# Patient Record
Sex: Female | Born: 1935 | Race: White | Hispanic: No | Marital: Married | Smoking: Former smoker
Health system: Southern US, Community
[De-identification: ages and names within clinical notes are randomized; demographics above are authoritative.]

## PROBLEM LIST (undated history)

## (undated) DIAGNOSIS — Z8709 Personal history of other diseases of the respiratory system: Secondary | ICD-10-CM

## (undated) DIAGNOSIS — J439 Emphysema, unspecified: Secondary | ICD-10-CM

## (undated) DIAGNOSIS — I2510 Atherosclerotic heart disease of native coronary artery without angina pectoris: Secondary | ICD-10-CM

## (undated) DIAGNOSIS — E785 Hyperlipidemia, unspecified: Secondary | ICD-10-CM

## (undated) DIAGNOSIS — I10 Essential (primary) hypertension: Secondary | ICD-10-CM

## (undated) HISTORY — PX: OTHER SURGICAL HISTORY: SHX169

## (undated) HISTORY — PX: CHOLECYSTECTOMY: SHX55

---

## 2015-08-09 ENCOUNTER — Emergency Department: Payer: Medicare Other

## 2015-08-09 ENCOUNTER — Inpatient Hospital Stay
Admission: EM | Admit: 2015-08-09 | Discharge: 2015-08-13 | DRG: 190 | Disposition: A | Discharge status: Home Health | Payer: Medicare Other | Attending: Internal Medicine | Admitting: Internal Medicine

## 2015-08-09 ENCOUNTER — Encounter: Payer: Self-pay | Admitting: Emergency Medicine

## 2015-08-09 DIAGNOSIS — J189 Pneumonia, unspecified organism: Secondary | ICD-10-CM | POA: Diagnosis present

## 2015-08-09 DIAGNOSIS — Z801 Family history of malignant neoplasm of trachea, bronchus and lung: Secondary | ICD-10-CM | POA: Diagnosis not present

## 2015-08-09 DIAGNOSIS — R0600 Dyspnea, unspecified: Secondary | ICD-10-CM

## 2015-08-09 DIAGNOSIS — I509 Heart failure, unspecified: Secondary | ICD-10-CM

## 2015-08-09 DIAGNOSIS — R0902 Hypoxemia: Secondary | ICD-10-CM

## 2015-08-09 DIAGNOSIS — M7989 Other specified soft tissue disorders: Secondary | ICD-10-CM

## 2015-08-09 DIAGNOSIS — I352 Nonrheumatic aortic (valve) stenosis with insufficiency: Secondary | ICD-10-CM | POA: Diagnosis present

## 2015-08-09 DIAGNOSIS — J440 Chronic obstructive pulmonary disease with acute lower respiratory infection: Principal | ICD-10-CM | POA: Diagnosis present

## 2015-08-09 DIAGNOSIS — I5041 Acute combined systolic (congestive) and diastolic (congestive) heart failure: Secondary | ICD-10-CM | POA: Diagnosis present

## 2015-08-09 DIAGNOSIS — Z79899 Other long term (current) drug therapy: Secondary | ICD-10-CM | POA: Diagnosis not present

## 2015-08-09 DIAGNOSIS — E785 Hyperlipidemia, unspecified: Secondary | ICD-10-CM | POA: Diagnosis present

## 2015-08-09 DIAGNOSIS — Z7982 Long term (current) use of aspirin: Secondary | ICD-10-CM

## 2015-08-09 DIAGNOSIS — J9621 Acute and chronic respiratory failure with hypoxia: Secondary | ICD-10-CM | POA: Diagnosis present

## 2015-08-09 DIAGNOSIS — Z87891 Personal history of nicotine dependence: Secondary | ICD-10-CM

## 2015-08-09 DIAGNOSIS — F419 Anxiety disorder, unspecified: Secondary | ICD-10-CM | POA: Diagnosis present

## 2015-08-09 DIAGNOSIS — Z9981 Dependence on supplemental oxygen: Secondary | ICD-10-CM

## 2015-08-09 DIAGNOSIS — J441 Chronic obstructive pulmonary disease with (acute) exacerbation: Secondary | ICD-10-CM

## 2015-08-09 DIAGNOSIS — I350 Nonrheumatic aortic (valve) stenosis: Secondary | ICD-10-CM

## 2015-08-09 DIAGNOSIS — I5021 Acute systolic (congestive) heart failure: Secondary | ICD-10-CM | POA: Insufficient documentation

## 2015-08-09 DIAGNOSIS — I420 Dilated cardiomyopathy: Secondary | ICD-10-CM

## 2015-08-09 HISTORY — DX: Personal history of other diseases of the respiratory system: Z87.09

## 2015-08-09 HISTORY — DX: Emphysema, unspecified: J43.9

## 2015-08-09 LAB — CBC WITH DIFFERENTIAL/PLATELET
Basophils Absolute: 0 10*3/uL (ref 0–0.1)
Basophils Relative: 1 %
EOS ABS: 0.1 10*3/uL (ref 0–0.7)
Eosinophils Relative: 1 %
HCT: 40.4 % (ref 35.0–47.0)
Hemoglobin: 13.4 g/dL (ref 12.0–16.0)
Lymphocytes Relative: 10 %
Lymphs Abs: 0.7 10*3/uL — ABNORMAL LOW (ref 1.0–3.6)
MCH: 30.4 pg (ref 26.0–34.0)
MCHC: 33.1 g/dL (ref 32.0–36.0)
MCV: 91.8 fL (ref 80.0–100.0)
Monocytes Absolute: 0.5 10*3/uL (ref 0.2–0.9)
Monocytes Relative: 6 %
NEUTROS ABS: 6.2 10*3/uL (ref 1.4–6.5)
PLATELETS: 256 10*3/uL (ref 150–440)
RBC: 4.4 MIL/uL (ref 3.80–5.20)
RDW: 14.5 % (ref 11.5–14.5)
WBC: 7.5 10*3/uL (ref 3.6–11.0)

## 2015-08-09 LAB — COMPREHENSIVE METABOLIC PANEL
ALBUMIN: 3.7 g/dL (ref 3.5–5.0)
ALT: 27 U/L (ref 14–54)
ANION GAP: 8 (ref 5–15)
AST: 37 U/L (ref 15–41)
Alkaline Phosphatase: 66 U/L (ref 38–126)
BILIRUBIN TOTAL: 1 mg/dL (ref 0.3–1.2)
BUN: 14 mg/dL (ref 6–20)
CO2: 31 mmol/L (ref 22–32)
Calcium: 9.1 mg/dL (ref 8.9–10.3)
Chloride: 99 mmol/L — ABNORMAL LOW (ref 101–111)
Creatinine, Ser: 0.57 mg/dL (ref 0.44–1.00)
Glucose, Bld: 107 mg/dL — ABNORMAL HIGH (ref 65–99)
POTASSIUM: 4.2 mmol/L (ref 3.5–5.1)
Sodium: 138 mmol/L (ref 135–145)
TOTAL PROTEIN: 6.4 g/dL — AB (ref 6.5–8.1)

## 2015-08-09 LAB — TROPONIN I: Troponin I: <0.03 ng/mL (ref <0.031)

## 2015-08-09 LAB — BRAIN NATRIURETIC PEPTIDE: B NATRIURETIC PEPTIDE 5: 999 pg/mL — AB (ref 0.0–100.0)

## 2015-08-09 LAB — TSH: TSH: 2.045 u[IU]/mL (ref 0.350–4.500)

## 2015-08-09 MED ORDER — ACETAMINOPHEN 650 MG RE SUPP: 650.0000 mg | Freq: Four times a day (QID) | RECTAL | Status: DC | PRN

## 2015-08-09 MED ORDER — ACETAMINOPHEN 325 MG PO TABS: 650.0000 mg | ORAL_TABLET | Freq: Four times a day (QID) | ORAL | Status: DC | PRN

## 2015-08-09 MED ORDER — SENNOSIDES-DOCUSATE SODIUM 8.6-50 MG PO TABS: 1.0000 | ORAL_TABLET | Freq: Every evening | ORAL | Status: DC | PRN

## 2015-08-09 MED ORDER — ENOXAPARIN SODIUM 40 MG/0.4ML ~~LOC~~ SOLN
40.0000 mg | SUBCUTANEOUS | Status: DC
  Administered 2015-08-09 – 2015-08-12 (×4): 40 mg via SUBCUTANEOUS
  Filled 2015-08-09 (×4): qty 0.4

## 2015-08-09 MED ORDER — HYDROCODONE-ACETAMINOPHEN 5-325 MG PO TABS: 1.0000 | ORAL_TABLET | ORAL | Status: DC | PRN

## 2015-08-09 MED ORDER — ASPIRIN 81 MG PO CHEW
81.0000 mg | CHEWABLE_TABLET | Freq: Every day | ORAL | Status: DC
  Administered 2015-08-09 – 2015-08-11 (×3): 81 mg via ORAL
  Filled 2015-08-09 (×3): qty 1

## 2015-08-09 MED ORDER — IPRATROPIUM-ALBUTEROL 0.5-2.5 (3) MG/3ML IN SOLN
3.0000 mL | Freq: Once | RESPIRATORY_TRACT | Status: AC
  Administered 2015-08-09: 3 mL via RESPIRATORY_TRACT
  Filled 2015-08-09: qty 3

## 2015-08-09 MED ORDER — ONDANSETRON HCL 4 MG/2ML IJ SOLN: 4.0000 mg | Freq: Four times a day (QID) | INTRAMUSCULAR | Status: DC | PRN

## 2015-08-09 MED ORDER — FUROSEMIDE 10 MG/ML IJ SOLN
40.0000 mg | Freq: Once | INTRAMUSCULAR | Status: AC
  Administered 2015-08-09: 40 mg via INTRAVENOUS
  Filled 2015-08-09: qty 4

## 2015-08-09 MED ORDER — ALBUTEROL SULFATE (2.5 MG/3ML) 0.083% IN NEBU: 2.5000 mg | INHALATION_SOLUTION | RESPIRATORY_TRACT | Status: DC | PRN

## 2015-08-09 MED ORDER — MONTELUKAST SODIUM 10 MG PO TABS
10.0000 mg | ORAL_TABLET | Freq: Every day | ORAL | Status: DC
  Administered 2015-08-09 – 2015-08-11 (×3): 10 mg via ORAL
  Filled 2015-08-09 (×3): qty 1

## 2015-08-09 MED ORDER — ONDANSETRON HCL 4 MG PO TABS: 4.0000 mg | ORAL_TABLET | Freq: Four times a day (QID) | ORAL | Status: DC | PRN

## 2015-08-09 MED ORDER — MELOXICAM 7.5 MG PO TABS
7.5000 mg | ORAL_TABLET | Freq: Every day | ORAL | Status: DC
  Administered 2015-08-09 – 2015-08-10 (×2): 7.5 mg via ORAL
  Filled 2015-08-09 (×3): qty 1

## 2015-08-09 MED ORDER — TEMAZEPAM 15 MG PO CAPS
30.0000 mg | ORAL_CAPSULE | Freq: Every evening | ORAL | Status: DC | PRN
  Administered 2015-08-10 – 2015-08-12 (×3): 30 mg via ORAL
  Filled 2015-08-09 (×3): qty 2

## 2015-08-09 MED ORDER — SODIUM CHLORIDE 0.9% FLUSH
3.0000 mL | Freq: Two times a day (BID) | INTRAVENOUS | Status: DC
  Administered 2015-08-09 – 2015-08-13 (×8): 3 mL via INTRAVENOUS

## 2015-08-09 MED ORDER — MOMETASONE FURO-FORMOTEROL FUM 200-5 MCG/ACT IN AERO
2.0000 | INHALATION_SPRAY | Freq: Two times a day (BID) | RESPIRATORY_TRACT | Status: DC
  Administered 2015-08-09 – 2015-08-10 (×2): 2 via RESPIRATORY_TRACT
  Filled 2015-08-09: qty 8.8

## 2015-08-09 MED ORDER — CITALOPRAM HYDROBROMIDE 20 MG PO TABS
10.0000 mg | ORAL_TABLET | Freq: Every day | ORAL | Status: DC
  Administered 2015-08-09 – 2015-08-13 (×5): 10 mg via ORAL
  Filled 2015-08-09 (×6): qty 1

## 2015-08-09 MED ORDER — NEBIVOLOL HCL 5 MG PO TABS
5.0000 mg | ORAL_TABLET | Freq: Every day | ORAL | Status: DC
  Administered 2015-08-09 – 2015-08-11 (×3): 5 mg via ORAL
  Filled 2015-08-09 (×3): qty 1

## 2015-08-09 MED ORDER — CALCIUM CARBONATE ANTACID 500 MG PO CHEW: 1.0000 | CHEWABLE_TABLET | ORAL | Status: DC | PRN

## 2015-08-09 MED ORDER — PRAVASTATIN SODIUM 20 MG PO TABS
20.0000 mg | ORAL_TABLET | Freq: Every day | ORAL | Status: DC
  Administered 2015-08-09 – 2015-08-12 (×4): 20 mg via ORAL
  Filled 2015-08-09: qty 1
  Filled 2015-08-09: qty 0.5
  Filled 2015-08-09 (×3): qty 1

## 2015-08-09 MED ORDER — OCUVITE-LUTEIN PO CAPS
1.0000 | ORAL_CAPSULE | Freq: Two times a day (BID) | ORAL | Status: DC
  Administered 2015-08-09 – 2015-08-13 (×8): 1 via ORAL
  Filled 2015-08-09 (×8): qty 1

## 2015-08-09 MED ORDER — IPRATROPIUM-ALBUTEROL 0.5-2.5 (3) MG/3ML IN SOLN
3.0000 mL | RESPIRATORY_TRACT | Status: DC
  Administered 2015-08-09 – 2015-08-13 (×24): 3 mL via RESPIRATORY_TRACT
  Filled 2015-08-09 (×25): qty 3

## 2015-08-09 MED ORDER — FUROSEMIDE 10 MG/ML IJ SOLN
40.0000 mg | Freq: Once | INTRAMUSCULAR | Status: AC
  Administered 2015-08-10: 40 mg via INTRAVENOUS
  Filled 2015-08-09: qty 4

## 2015-08-09 NOTE — Consult Note (Signed)
Order received to provide Adv Directive information to patient.  Provided education to patient and spouse, left documents and let them know to have RN contact spiritual care when they are ready to complete.

## 2015-08-09 NOTE — ED Notes (Signed)
Admitting MD at bedside.

## 2015-08-09 NOTE — Progress Notes (Signed)
Patient admitted to unit from ED for shortness of breath and weakness, patient alert and oriented, denies any pain at thus time. Family at bedside, admission assessment completed at this time.

## 2015-08-09 NOTE — ED Notes (Signed)
Patient ambulated to bathroom x 1 assist.

## 2015-08-09 NOTE — H&P (Addendum)
[place] [facility] [place] NAME: Brooke Trevino    MR#:  [id number]  DATE OF BIRTH:  1936-01-08  DATE OF ADMISSION:  08/09/2015  PRIMARY CARE PHYSICIAN: [facility], Chrissie Noa, MD   REQUESTING/REFERRING PHYSICIAN: Dr Cinda Quest  CHIEF COMPLAINT:   SOB   HISTORY OF PRESENT ILLNESS:  Brooke Trevino  is a 80 y.o. female with a known history of COPD who presents with increasing shortness of breath for the past few days. She also reports PND, orthopnea and lower extremity edema. She is not taking Lasix as she thinks this is drying her skin out. Chest x-ray in the emergency room shows new congestive heart failure and atelectasis. She has been given 49 g IV Lasix  PAST MEDICAL HISTORY:   Past Medical History  Diagnosis Date  . History of COPD   . Emphysema lung ([place])     PAST SURGICAL HISTORY:   Past Surgical History  Procedure Laterality Date  . Cholecystectomy    . Ear drum      Ear drum replacement    SOCIAL HISTORY:   Social History  Substance Use Topics  . Smoking status: Former Smoker    Quit date: 08/08/2005  . Smokeless tobacco: No  . Alcohol Use: No    FAMILY HISTORY:  Lung cancer  DRUG ALLERGIES:  No Known Allergies  REVIEW OF SYSTEMS:   Review of Systems  Constitutional: Negative for fever, chills and malaise/fatigue.  HENT: Negative for ear discharge, ear pain, hearing loss, nosebleeds and sore throat.   Eyes: Negative for blurred vision and pain.  Respiratory: Positive for shortness of breath and wheezing. Negative for cough and hemoptysis.   Cardiovascular: Negative for chest pain, palpitations and leg swelling.  Gastrointestinal: Negative for nausea, vomiting, abdominal pain, diarrhea and blood in stool.  Genitourinary: Negative for dysuria.  Musculoskeletal: Negative for back pain.  Neurological: Negative for dizziness, tremors, speech change, focal weakness, seizures and headaches.  Endo/Heme/Allergies: Does not  bruise/bleed easily.  Psychiatric/Behavioral: Negative for depression, suicidal ideas and hallucinations.    MEDICATIONS AT HOME:   Prior to Admission medications   Medication Sig Start Date End Date Taking? Authorizing Provider  albuterol (PROVENTIL) (2.5 MG/3ML) 0.083% nebulizer solution Take 2.5 mg by nebulization every 4 (four) hours as needed for wheezing or shortness of breath.   Yes Historical Provider, MD  aspirin 81 MG chewable tablet Chew 81 mg by mouth at bedtime.   Yes Historical Provider, MD  calcium carbonate (TUMS - DOSED IN MG ELEMENTAL CALCIUM) 500 MG chewable tablet Chew 1 tablet by mouth as needed for indigestion or heartburn.   Yes Historical Provider, MD  citalopram (CELEXA) 10 MG tablet Take 10 mg by mouth daily.   Yes Historical Provider, MD  Fluticasone-Salmeterol (ADVAIR) 250-50 MCG/DOSE AEPB Inhale 1 puff into the lungs 2 (two) times daily.   Yes Historical Provider, MD  furosemide (LASIX) 40 MG tablet Take 40 mg by mouth daily.   Yes Historical Provider, MD  ipratropium-albuterol (DUONEB) 0.5-2.5 (3) MG/3ML SOLN Take 3 mLs by nebulization every 4 (four) hours.   Yes Historical Provider, MD  meloxicam (MOBIC) 7.5 MG tablet Take 7.5 mg by mouth daily.   Yes Historical Provider, MD  montelukast (SINGULAIR) 10 MG tablet Take 10 mg by mouth at bedtime.   Yes Historical Provider, MD  Multiple Vitamins-Minerals (PRESERVISION/LUTEIN) CAPS Take 1 capsule by mouth 2 (two) times daily.   Yes Historical Provider, MD  nebivolol (BYSTOLIC) 5 MG tablet  Take 5 mg by mouth at bedtime.   Yes Historical Provider, MD  pravastatin (PRAVACHOL) 20 MG tablet Take 20 mg by mouth at bedtime.   Yes Historical Provider, MD  temazepam (RESTORIL) 30 MG capsule Take 30 mg by mouth at bedtime as needed for sleep.   Yes Historical Provider, MD      VITAL SIGNS:  Blood pressure 127/68, pulse 104, temperature 98.4 F (36.9 C), temperature source Oral, resp. rate 24, height '5\' 2"'$  (1.575 m), weight  53.524 kg (118 lb), SpO2 95 %.  PHYSICAL EXAMINATION:   Physical Exam    LABORATORY PANEL:   CBC  Recent Labs Lab 08/09/15 1204  WBC 7.5  HGB 13.4  HCT 40.4  PLT 256   ------------------------------------------------------------------------------------------------------------------  Chemistries   Recent Labs Lab 08/09/15 1204  NA 138  K 4.2  CL 99*  CO2 31  GLUCOSE 107*  BUN 14  CREATININE 0.57  CALCIUM 9.1  AST 37  ALT 27  ALKPHOS 66  BILITOT 1.0   ------------------------------------------------------------------------------------------------------------------  Cardiac Enzymes  Recent Labs Lab 08/09/15 1204  TROPONINI <0.03   ------------------------------------------------------------------------------------------------------------------  RADIOLOGY:  Dg Chest Portable 1 View  08/09/2015  CLINICAL DATA:  Short of breath.  History COPD and emphysema. EXAM: PORTABLE CHEST 1 VIEW COMPARISON:  None. FINDINGS: Hyperinflation. Midline trachea. Mild cardiomegaly. Small bilateral pleural effusions. No pneumothorax. Pulmonary interstitial prominence. Mild. Patchy bibasilar airspace disease. IMPRESSION: Congestive heart failure with bilateral pleural effusions. Bibasilar airspace disease, favoring atelectasis. Hyperinflation, consistent with COPD Electronically Signed   By: Abigail Miyamoto M.D.   On: 08/09/2015 12:35    EKG:  Sinus tachycardia no ST elevation or depression  IMPRESSION AND PLAN:   80 year old female with chronic respiratory failure on 2 L of oxygen due to COPD who presents with acute on chronic hypoxic respiratory failure with new onset CHF.  1. Acute on chronic hypoxic respiratory failure: This is due to new onset congestive heart failure. Wean to her baseline 2 L of oxygen as tolerated. Start ISS. 2. Acute congestive heart failure: Order echocardiogram to evaluate ejection fraction and wall motion abnormalities as well as a possibility of right  heart failure. Continue 40 mg IV Lasix. Check TSH. Monitor intake and output.   3. COPD with acute on chronic hypoxic respiratory failure: Patient does not appear to be in exacerbation at this time. She would benefit from continuing her outpatient nebulizer treatments and inhalers.  4. Hyperlipidemia: Continue pravastatin.    All the records are reviewed and case discussed with ED provider. Management plans discussed with the patient and she in agreement  CODE STATUS: FULL  TOTAL TIME TAKING CARE OF THIS PATIENT: 50 minutes.    Shemeca Lukasik M.D on 08/09/2015 at 2:23 PM  Between 7am to 6pm - Pager - [phone]  After 6pm go to [email or web address] - password [email or web address] [place] [facility]  Office  [phone]  CC: Primary care physician; [facility], Chrissie Noa, MD

## 2015-08-09 NOTE — ED Provider Notes (Signed)
[facility] Emergency Department Provider Note   ____________________________________________  Time seen: Approximately 1:51 PM  I have reviewed the triage vital signs and the nursing notes.   HISTORY  Chief Complaint Shortness of Breath    HPI Brynja Marker is a 80 y.o. female who reports increasing shortness of breath gradually for quite some time. He got very bad in the last 2 days and even worse last night. Patient was satting at 78% on HER-2 liters this morning. She denies any cough or pain just shortness of breath. More swelling in her legs and usual.  Past Medical History  Diagnosis Date  . History of COPD   . Emphysema lung ([place])     There are no active problems to display for this patient.   Past Surgical History  Procedure Laterality Date  . Cholecystectomy    . Ear drum      Ear drum replacement    No current outpatient prescriptions on file.  Allergies Review of patient's allergies indicates no known allergies.  No family history on file.  Social History Social History  Substance Use Topics  . Smoking status: Former Smoker    Quit date: 08/08/2005  . Smokeless tobacco: None  . Alcohol Use: No    Review of Systems Constitutional: No fever/chills Eyes: No visual changes. ENT: No sore throat. Cardiovascular: Denies chest pain. Respiratory:shortness of breath. Gastrointestinal: No abdominal pain.  No nausea, no vomiting.  No diarrhea.  No constipation. Genitourinary: Negative for dysuria. Musculoskeletal: Negative for back pain. Skin: Negative for rash. Neurological: Negative for headaches, focal weakness or numbness.  10-point ROS otherwise negative.  ____________________________________________   PHYSICAL EXAM:  VITAL SIGNS: ED Triage Vitals  Enc Vitals Group     BP 08/09/15 1202 169/98 mmHg     Pulse Rate 08/09/15 1158 103     Resp 08/09/15 1158 25     Temp 08/09/15 1158 98.4 F (36.9 C)   Temp Source 08/09/15 1158 Oral     SpO2 08/09/15 1158 98 %     Weight 08/09/15 1158 118 lb (53.524 kg)     Height 08/09/15 1158 5' 2" (1.575 m)     Head Cir --      Peak Flow --      Pain Score --      Pain Loc --      Pain Edu? --      Excl. in [place]? --     Constitutional: Alert and oriented. Well appearing and in no acute distress. Eyes: Conjunctivae are normal. PERRL. EOMI. Head: Atraumatic. Nose: No congestion/rhinnorhea. Mouth/Throat: Mucous membranes are moist.  Oropharynx non-erythematous. Neck: No stridor.  Cardiovascular: Normal rate, regular rhythm. Grossly normal heart sounds.  Good peripheral circulation. Respiratory: Normal respiratory effort.  No retractions. Lungs CTAB. Gastrointestinal: Soft and nontender. No distention. No abdominal bruits. No CVA tenderness. Musculoskeletal: No lower extremity tenderness nor edema.  No joint effusions. Neurologic:  Normal speech and language. No gross focal neurologic deficits are appreciated. No gait instability. Skin:  Skin is warm, dry and intact. No rash noted. Psychiatric: Mood and affect are normal. Speech and behavior are normal.  ____________________________________________   LABS (all labs ordered are listed, but only abnormal results are displayed)  Labs Reviewed  COMPREHENSIVE METABOLIC PANEL - Abnormal; Notable for the following:    Chloride 99 (*)    Glucose, Bld 107 (*)    Total Protein 6.4 (*)    All other components within normal limits  BRAIN NATRIURETIC PEPTIDE - Abnormal; Notable for the following:    B Natriuretic Peptide 999.0 (*)    All other components within normal limits  CBC WITH DIFFERENTIAL/PLATELET - Abnormal; Notable for the following:    Lymphs Abs 0.7 (*)    All other components within normal limits  TROPONIN I   ____________________________________________  EKG  EKG read and interpreted by me shows sinus tachycardia rate of 1040 axis is a little bit of slight ST segment depression in  the 6. I do not have any old EKGs to compare to ____________________________________________  RADIOLOGY  Chest x-ray looks like a combination of congestive heart failure and COPD. Radiologist agrees with this reading. ____________________________________________   PROCEDURES    ____________________________________________   INITIAL IMPRESSION / ASSESSMENT AND PLAN / ED COURSE  Pertinent labs & imaging results that were available during my care of the patient were reviewed by me and considered in my medical decision making (see chart for details).   ____________________________________________   FINAL CLINICAL IMPRESSION(S) / ED DIAGNOSES  Final diagnoses:  Hypoxia  Dyspnea  Chronic obstructive pulmonary disease with acute exacerbation (HCC)  Acute combined systolic and diastolic congestive heart failure (HCC)      NEW MEDICATIONS STARTED DURING THIS VISIT:  New Prescriptions   No medications on file     Note:  This document was prepared using Dragon voice recognition software and may include unintentional dictation errors.    Nena Polio, MD 08/09/15 1356

## 2015-08-09 NOTE — ED Notes (Signed)
Per EMS, patient comes from home (lives with her husband) due to SOB. Hx of COPD and emphysema. Patient is on 2L of O2 at home. At home she is on a on demand concentrator. When EMS arrived patient was sating 78%. Patient had a breathing treatment at home this morning and EMS gave a duoneb treatment in route and placed patient on 4L [place]. Room air sat here is 78%. Patient placed back on 4L [place] now sating 98%. Patient denies pain. A&O x4. NSR on monitor.

## 2015-08-10 ENCOUNTER — Inpatient Hospital Stay: Payer: Medicare Other

## 2015-08-10 ENCOUNTER — Inpatient Hospital Stay
Admit: 2015-08-10 | Discharge: 2015-08-10 | Disposition: A | Discharge status: Home / Self Care | Payer: Medicare Other | Attending: Internal Medicine | Admitting: Internal Medicine

## 2015-08-10 LAB — BASIC METABOLIC PANEL
ANION GAP: 7 (ref 5–15)
BUN: 13 mg/dL (ref 6–20)
CALCIUM: 8.9 mg/dL (ref 8.9–10.3)
CO2: 35 mmol/L — ABNORMAL HIGH (ref 22–32)
Chloride: 98 mmol/L — ABNORMAL LOW (ref 101–111)
Creatinine, Ser: 0.54 mg/dL (ref 0.44–1.00)
GLUCOSE: 87 mg/dL (ref 65–99)
Potassium: 3.7 mmol/L (ref 3.5–5.1)
SODIUM: 140 mmol/L (ref 135–145)

## 2015-08-10 LAB — CBC
HCT: 40.2 % (ref 35.0–47.0)
HEMOGLOBIN: 13.2 g/dL (ref 12.0–16.0)
MCH: 30.8 pg (ref 26.0–34.0)
MCHC: 32.8 g/dL (ref 32.0–36.0)
MCV: 93.8 fL (ref 80.0–100.0)
Platelets: 231 10*3/uL (ref 150–440)
RBC: 4.29 MIL/uL (ref 3.80–5.20)
RDW: 14.4 % (ref 11.5–14.5)
WBC: 6.6 10*3/uL (ref 3.6–11.0)

## 2015-08-10 LAB — FIBRIN DERIVATIVES D-DIMER (ARMC ONLY): FIBRIN DERIVATIVES D-DIMER (ARMC): 754 — AB (ref 0–499)

## 2015-08-10 MED ORDER — LEVOFLOXACIN IN D5W 750 MG/150ML IV SOLN
750.0000 mg | INTRAVENOUS | Status: DC
  Administered 2015-08-11: 750 mg via INTRAVENOUS
  Filled 2015-08-10 (×4): qty 150

## 2015-08-10 MED ORDER — FUROSEMIDE 10 MG/ML IJ SOLN
20.0000 mg | Freq: Two times a day (BID) | INTRAMUSCULAR | Status: DC
  Administered 2015-08-10: 20 mg via INTRAVENOUS
  Filled 2015-08-10: qty 2

## 2015-08-10 MED ORDER — BUDESONIDE 0.25 MG/2ML IN SUSP
RESPIRATORY_TRACT | Status: AC
  Filled 2015-08-10: qty 2

## 2015-08-10 MED ORDER — BUDESONIDE 0.25 MG/2ML IN SUSP
0.2500 mg | Freq: Two times a day (BID) | RESPIRATORY_TRACT | Status: DC
  Administered 2015-08-10 – 2015-08-13 (×6): 0.25 mg via RESPIRATORY_TRACT
  Filled 2015-08-10 (×4): qty 2

## 2015-08-10 MED ORDER — POTASSIUM CHLORIDE CRYS ER 20 MEQ PO TBCR
20.0000 meq | EXTENDED_RELEASE_TABLET | Freq: Two times a day (BID) | ORAL | Status: DC
  Administered 2015-08-10 – 2015-08-13 (×7): 20 meq via ORAL
  Filled 2015-08-10 (×7): qty 1

## 2015-08-10 MED ORDER — ALBUTEROL SULFATE (2.5 MG/3ML) 0.083% IN NEBU
2.5000 mg | INHALATION_SOLUTION | RESPIRATORY_TRACT | Status: DC
  Administered 2015-08-10: 2.5 mg via RESPIRATORY_TRACT

## 2015-08-10 MED ORDER — ALBUTEROL SULFATE (2.5 MG/3ML) 0.083% IN NEBU: 2.5000 mg | INHALATION_SOLUTION | RESPIRATORY_TRACT | Status: DC | PRN

## 2015-08-10 NOTE — Consult Note (Signed)
Pharmacy Antibiotic Note  Brooke Trevino is a 80 y.o. female admitted on 08/09/2015 with possible PNA.  Pharmacy has been consulted for fevofloxacin dosing.  Plan: levofloxacin '750mg'$  q 48 hours  Height: '5\' 2"'$  (157.5 cm) Weight: 110 lb 12.8 oz (50.259 kg) IBW/kg (Calculated) : 50.1  Temp (24hrs), Avg:97.7 F (36.5 C), Min:97.5 F (36.4 C), Max:97.8 F (36.6 C)   Recent Labs Lab 08/09/15 1204 08/10/15 0454  WBC 7.5 6.6  CREATININE 0.57 0.54    Estimated Creatinine Clearance: 45.8 mL/min (by C-G formula based on Cr of 0.54).    No Known Allergies  Antimicrobials this admission: levofloxacin 5/30 >>    Dose adjustments this admission:   Microbiology results:   Thank you for allowing pharmacy to be a part of this patient's care.  Ramond Dial, Pharm.D Clinical Pharmacist   08/10/2015 5:46 PM

## 2015-08-10 NOTE — Consult Note (Addendum)
Reason for Consult:CHF/COPD Referring Physician: Dr Dinah Beers is an 80 y.o. female.  HPI: Pt c/o of sob and COPD.She denies chest pain no swelling . Patient presents with profound shortness of breath dyspnea she recently moved from [place] head and states emphysema COPD on home O2 for hypoxemia. Patient denies any chest pain denies any cardiac history has a extensive history of smoking over 50 pack years. Patient states to feel reasonably well except for extreme dyspnea denies any cough and no fever chills or sweats no sputum production. There is some concern about possible heart failure as a cardiology was recommended for evaluation patient had not seen a cardiologist before either here or in [place]. Patient denies any leg swelling no PND or orthopnea she still has significant dyspnea with without her oxygen.  Past Medical History  Diagnosis Date  . History of COPD   . Emphysema lung [facility])     Past Surgical History  Procedure Laterality Date  . Cholecystectomy    . Ear drum      Ear drum replacement    History reviewed. No pertinent family history.  Social History:  reports that she quit smoking about 10 years ago. She does not have any smokeless tobacco history on file. She reports that she does not drink alcohol. Her drug history is not on file.  Allergies: No Known Allergies  Medications: I have reviewed the patient's current medications.  Results for orders placed or performed during the hospital encounter of 08/09/15 (from the past 48 hour(s))  Comprehensive metabolic panel     Status: Abnormal   Collection Time: 08/09/15 12:04 PM  Result Value Ref Range   Sodium 138 135 - 145 mmol/L   Potassium 4.2 3.5 - 5.1 mmol/L   Chloride 99 (L) 101 - 111 mmol/L   CO2 31 22 - 32 mmol/L   Glucose, Bld 107 (H) 65 - 99 mg/dL   BUN 14 6 - 20 mg/dL   Creatinine, Ser 0.57 0.44 - 1.00 mg/dL   Calcium 9.1 8.9 - 10.3 mg/dL   Total Protein 6.4 (L) 6.5 - 8.1 g/dL    Albumin 3.7 3.5 - 5.0 g/dL   AST 37 15 - 41 U/L   ALT 27 14 - 54 U/L   Alkaline Phosphatase 66 38 - 126 U/L   Total Bilirubin 1.0 0.3 - 1.2 mg/dL   GFR calc non Af Amer >60 >60 mL/min   GFR calc Af Amer >60 >60 mL/min    Comment: (NOTE) The eGFR has been calculated using the CKD EPI equation. This calculation has not been validated in all clinical situations. eGFR's persistently <60 mL/min signify possible Chronic Kidney Disease.    Anion gap 8 5 - 15  CBC with Differential     Status: Abnormal   Collection Time: 08/09/15 12:04 PM  Result Value Ref Range   WBC 7.5 3.6 - 11.0 K/uL   RBC 4.40 3.80 - 5.20 MIL/uL   Hemoglobin 13.4 12.0 - 16.0 g/dL   HCT 40.4 35.0 - 47.0 %   MCV 91.8 80.0 - 100.0 fL   MCH 30.4 26.0 - 34.0 pg   MCHC 33.1 32.0 - 36.0 g/dL   RDW 14.5 11.5 - 14.5 %   Platelets 256 150 - 440 K/uL   Neutrophils Relative % 82% %   Neutro Abs 6.2 1.4 - 6.5 K/uL   Lymphocytes Relative 10% %   Lymphs Abs 0.7 (L) 1.0 - 3.6 K/uL   Monocytes Relative 6% %  Monocytes Absolute 0.5 0.2 - 0.9 K/uL   Eosinophils Relative 1% %   Eosinophils Absolute 0.1 0 - 0.7 K/uL   Basophils Relative 1% %   Basophils Absolute 0.0 0 - 0.1 K/uL  Troponin I     Status: None   Collection Time: 08/09/15 12:04 PM  Result Value Ref Range   Troponin I <0.03 <0.031 ng/mL    Comment:        NO INDICATION OF MYOCARDIAL INJURY.   TSH     Status: None   Collection Time: 08/09/15 12:04 PM  Result Value Ref Range   TSH 2.045 0.350 - 4.500 uIU/mL  Brain natriuretic peptide     Status: Abnormal   Collection Time: 08/09/15 12:05 PM  Result Value Ref Range   B Natriuretic Peptide 999.0 (H) 0.0 - 100.0 pg/mL  Basic metabolic panel     Status: Abnormal   Collection Time: 08/10/15  4:54 AM  Result Value Ref Range   Sodium 140 135 - 145 mmol/L   Potassium 3.7 3.5 - 5.1 mmol/L   Chloride 98 (L) 101 - 111 mmol/L   CO2 35 (H) 22 - 32 mmol/L   Glucose, Bld 87 65 - 99 mg/dL   BUN 13 6 - 20 mg/dL    Creatinine, Ser 0.54 0.44 - 1.00 mg/dL   Calcium 8.9 8.9 - 10.3 mg/dL   GFR calc non Af Amer >60 >60 mL/min   GFR calc Af Amer >60 >60 mL/min    Comment: (NOTE) The eGFR has been calculated using the CKD EPI equation. This calculation has not been validated in all clinical situations. eGFR's persistently <60 mL/min signify possible Chronic Kidney Disease.    Anion gap 7 5 - 15  CBC     Status: None   Collection Time: 08/10/15  4:54 AM  Result Value Ref Range   WBC 6.6 3.6 - 11.0 K/uL   RBC 4.29 3.80 - 5.20 MIL/uL   Hemoglobin 13.2 12.0 - 16.0 g/dL   HCT 40.2 35.0 - 47.0 %   MCV 93.8 80.0 - 100.0 fL   MCH 30.8 26.0 - 34.0 pg   MCHC 32.8 32.0 - 36.0 g/dL   RDW 14.4 11.5 - 14.5 %   Platelets 231 150 - 440 K/uL  Fibrin derivatives D-Dimer ([facility] only)     Status: Abnormal   Collection Time: 08/10/15 10:46 AM  Result Value Ref Range   Fibrin derivatives D-dimer ([facility]) 754 (H) 0 - 499    Comment: <> Exclusion of Venous Thromboembolism (VTE) - OUTPATIENTS ONLY        (Emergency Department or [place])             0-499 ng/ml (FEU)  : With a low to intermediate pretest                                        probability for VTE this test result                                        excludes the diagnosis of VTE.           > 499 ng/ml (FEU)  : VTE not excluded.  Additional work up  for VTE is required.   <>  Testing on Inpatients and Evaluation of Disseminated Intravascular        Coagulation (DIC)             Reference Range:   0-499 ng/ml (FEU)     US Venous Img Lower Bilateral  08/10/2015  CLINICAL DATA:  Lower extremity swelling. EXAM: BILATERAL LOWER EXTREMITY VENOUS DOPPLER ULTRASOUND TECHNIQUE: Gray-scale sonography with graded compression, as well as color Doppler and duplex ultrasound were performed to evaluate the lower extremity deep venous systems from the level of the common femoral vein and including the common femoral, femoral, profunda  femoral, popliteal and calf veins including the posterior tibial, peroneal and gastrocnemius veins when visible. The superficial great saphenous vein was also interrogated. Spectral Doppler was utilized to evaluate flow at rest and with distal augmentation maneuvers in the common femoral, femoral and popliteal veins. COMPARISON:  None. FINDINGS: RIGHT LOWER EXTREMITY Normal compressibility, augmentation and color Doppler flow in the right common femoral vein, right femoral vein and right popliteal vein. The right saphenofemoral junction is patent. Right profunda femoral vein is patent without thrombus. Visualized right deep calf veins are patent without thrombus. LEFT LOWER EXTREMITY Normal compressibility, augmentation and color Doppler flow in the left common femoral vein, left femoral vein and left popliteal vein. The left saphenofemoral junction is patent. Left profunda femoral vein is patent without thrombus. Visualized left deep calf veins are patent without thrombus. Other Findings:  None. IMPRESSION: No evidence of deep venous thrombosis in the lower extremities. Electronically Signed   By: Markus Daft M.D.   On: 08/10/2015 15:28   Dg Chest Port 1 View  08/10/2015  CLINICAL DATA:  Chronic shortness-of-breath worsening today. EXAM: PORTABLE CHEST 1 VIEW COMPARISON:  08/09/2015 FINDINGS: Lungs are hyperexpanded with persistent bibasilar opacification compatible with small effusions and associated atelectasis without significant change. Cannot completely exclude infection in the lung bases. Stable cardiomegaly. Mild calcified plaque over the aortic arch. Remainder of the exam is unchanged. IMPRESSION: Stable small bilateral pleural effusions likely with associated basilar atelectasis. Cannot exclude infection in the lung bases. Stable cardiomegaly. Electronically Signed   By: Marin Olp M.D.   On: 08/10/2015 11:02   Dg Chest Portable 1 View  08/09/2015  CLINICAL DATA:  Short of breath.  History COPD and  emphysema. EXAM: PORTABLE CHEST 1 VIEW COMPARISON:  None. FINDINGS: Hyperinflation. Midline trachea. Mild cardiomegaly. Small bilateral pleural effusions. No pneumothorax. Pulmonary interstitial prominence. Mild. Patchy bibasilar airspace disease. IMPRESSION: Congestive heart failure with bilateral pleural effusions. Bibasilar airspace disease, favoring atelectasis. Hyperinflation, consistent with COPD Electronically Signed   By: Abigail Miyamoto M.D.   On: 08/09/2015 12:35    Review of Systems  Constitutional: Positive for malaise/fatigue.  HENT: Positive for congestion.   Eyes: Negative.   Respiratory: Positive for cough, shortness of breath and wheezing.   Cardiovascular: Positive for palpitations.  Gastrointestinal: Negative.   Genitourinary: Negative.   Musculoskeletal: Negative.   Skin: Negative.   Neurological: Positive for weakness.  Endo/Heme/Allergies: Negative.   Psychiatric/Behavioral: Negative.    Blood pressure 140/74, pulse 76, temperature 97.5 F (36.4 C), temperature source Oral, resp. rate 22, height 5' 2"  (1.575 m), weight 50.259 kg (110 lb 12.8 oz), SpO2 94 %. Physical Exam  Nursing note and vitals reviewed. Constitutional: She is oriented to person, place, and time. She appears well-developed and well-nourished.  HENT:  Head: Normocephalic and atraumatic.  Eyes: Pupils are equal, round, and reactive to light.  Neck:  Normal range of motion. Neck supple.  Cardiovascular: Normal rate.   Respiratory: Accessory muscle usage present. Tachypnea noted. She has decreased breath sounds. She has wheezes.  GI: Soft. Bowel sounds are normal.  Musculoskeletal: Normal range of motion.  Neurological: She is alert and oriented to person, place, and time. She has normal reflexes.  Skin: Skin is warm and dry.  Psychiatric: Judgment and thought content normal. Her mood appears anxious. Cognition and memory are normal.    Assessment/Plan: Shortness of  breath COPD Hypoxemia Hyperlipidemia Anxiety History of smoking but quit Possible heart failure . PLAN Agree with telemetry Continue inhalers Gross supplemental oxygen Do not recommend increasing dose of diuretics Continue Pravachol for lipid management Benzodiazepines for anxiety Continue diastolic blood pressure control      Brooke Trevino D. 08/10/2015, 4:39 PM

## 2015-08-10 NOTE — Evaluation (Signed)
Physical Therapy Evaluation Patient Details Name: Brooke Trevino MRN: [id number] DOB: 28-Oct-1935 Today's Date: 08/10/2015   History of Present Illness  Brooke Trevino  is a 80 y.o. female with a known history of COPD who presents with increasing shortness of breath for the past few days. She also reports PND, orthopnea and lower extremity edema. She is not taking Lasix as she thinks this is drying her skin out. Chest x-ray in the emergency room shows new congestive heart failure and atelectasis. She has been given 49 g IV Lasix. No falls in the last 12 months.   Clinical Impression  Pt demonstrates significant limitations due to cardiopulmonary function. She is reasonably stable with transfers and ambulation with rolling walker. Pt becomes very short of breath with ambulation from bed to bathroom with SaO2 dropping to 80% on 4 L/min O2. She would benefit from referral for outpatient pulmonary rehab. Encouraged pt to discuss with her pulmonologist. Pt will be safe to return home with husband at discharge. Pt will benefit from skilled PT services to address deficits in strength, balance, and mobility in order to return to full function at home.     Follow Up Recommendations Other (comment) (Pulmonary rehab)    Equipment Recommendations  None recommended by PT    Recommendations for Other Services       Precautions / Restrictions Precautions Precautions: Fall Restrictions Weight Bearing Restrictions: No      Mobility  Bed Mobility Overal bed mobility: Independent             General bed mobility comments: Pt demonstrates good speed and sequencing with supine to sitting at EOB  Transfers Overall transfer level: Needs assistance Equipment used: None Transfers: Sit to/from Stand Sit to Stand: Supervision         General transfer comment: Pt demonstrates good stability and strength with sit to stand transfer. She is able to come to standing with unilateral UE support and  takes small steps from bed to recliner holding onto bed rail. Pt able to perform sit to stand transfer to and from commode  Ambulation/Gait Ambulation/Gait assistance: Min guard Ambulation Distance (Feet): 15 Feet Assistive device: Rolling walker (2 wheeled) Gait Pattern/deviations: Step-through pattern Gait velocity: Decreased Gait velocity interpretation: Below normal speed for age/gender General Gait Details: Pt able to ambulate from bed to bathroom. She demonstrates some instability but corrected with rolling walker. No LE buckling noted. Pt on 4 L/min O2 during ambulation. SaO2 drops to 80% on supplmental O2. Requires extended seated rest break on 6 L/min to recover to 93%. Fatigue monitored throughout ambulation  Stairs            Wheelchair Mobility    Modified Rankin (Stroke Patients Only)       Balance Overall balance assessment: Needs assistance Sitting-balance support: No upper extremity supported Sitting balance-Leahy Scale: Good     Standing balance support: No upper extremity supported Standing balance-Leahy Scale: Fair                               Pertinent Vitals/Pain Pain Assessment: No/denies pain    Home Living Family/patient expects to be discharged to:: Private residence Living Arrangements: Spouse/significant other Available Help at Discharge: Family Type of Home: House Home Access: Level entry     Home Layout: One level Home Equipment: Wheelchair - [employer] - 4 wheels;Cane - single point;Shower seat;Shower seat - built in      Prior  Function Level of Independence: Needs assistance   Gait / Transfers Assistance Needed: Pt reports over the last few weeks she has been using a wheelchair for mobility due to difficulty breathing. Prior to that she would hold onto furniture for short distance ambulation. She is primarily limited by cardiopulmonary status  ADL's / Homemaking Assistance Needed: Independent  Comments: Husband  provides for IADLs     Hand Dominance   Dominant Hand: Right    Extremity/Trunk Assessment   Upper Extremity Assessment: Overall WFL for tasks assessed           Lower Extremity Assessment: Overall WFL for tasks assessed         Communication   Communication: No difficulties  Cognition Arousal/Alertness: Awake/alert Behavior During Therapy: WFL for tasks assessed/performed Overall Cognitive Status: Within Functional Limits for tasks assessed                      General Comments      Exercises        Assessment/Plan    PT Assessment Patient needs continued PT services  PT Diagnosis Difficulty walking;Abnormality of gait;Generalized weakness   PT Problem List Decreased strength;Decreased activity tolerance;Decreased balance;Cardiopulmonary status limiting activity  PT Treatment Interventions DME instruction;Gait training;Functional mobility training;Therapeutic activities;Therapeutic exercise;Balance training;Neuromuscular re-education;Patient/family education   PT Goals (Current goals can be found in the Care Plan section) Acute Rehab PT Goals Patient Stated Goal: "Improve her breathing so she can play with her grandchildren." PT Goal Formulation: With patient Time For Goal Achievement: 08/24/15 Potential to Achieve Goals: Fair    Frequency Min 2X/week   Barriers to discharge        Co-evaluation               End of Session Equipment Utilized During Treatment: Gait belt;Oxygen Activity Tolerance: Patient tolerated treatment well Patient left: in chair;with call bell/phone within reach;with chair alarm set Nurse Communication: Mobility status         Time: 2979-8921 PT Time Calculation (min) (ACUTE ONLY): 34 min   Charges:   PT Evaluation $PT Eval Low Complexity: 1 Procedure PT Treatments $Gait Training: 8-22 mins   PT G Codes:       Lyndel Safe Huprich PT, DPT   Huprich,Jason 08/10/2015, 12:55 PM

## 2015-08-10 NOTE — Progress Notes (Signed)
[facility] [place] NAME: Brooke Trevino    MR#:  [id number]  DATE OF BIRTH:  1936-01-09  SUBJECTIVE:  CHIEF COMPLAINT:   Chief Complaint  Patient presents with  . Shortness of Breath  Patient is 80 year old Caucasian female with medical history significant for history of history of COPD,  who presents to the hospital with complaints of increasing shortness of breath, lower extremity swelling, PND. In emergency room, chest x-ray revealed congestive heart failure, atelectasis. She was given 40 mg of Lasix 2 and at least 1.7 L. Today she does not feel much better despite diuresis, she still complains of shortness of breath, denies any cough, phlegm. Production, or wheezing  Review of Systems  Constitutional: Negative for fever, chills and weight loss.  HENT: Negative for congestion.   Eyes: Negative for blurred vision and double vision.  Respiratory: Positive for shortness of breath. Negative for cough, sputum production and wheezing.   Cardiovascular: Positive for palpitations, leg swelling and PND. Negative for chest pain and orthopnea.  Gastrointestinal: Negative for nausea, vomiting, abdominal pain, diarrhea, constipation and blood in stool.  Genitourinary: Negative for dysuria, urgency, frequency and hematuria.  Musculoskeletal: Negative for falls.  Neurological: Negative for dizziness, tremors, focal weakness and headaches.  Endo/Heme/Allergies: Does not bruise/bleed easily.  Psychiatric/Behavioral: Negative for depression. The patient does not have insomnia.     VITAL SIGNS: Blood pressure 140/74, pulse 76, temperature 97.5 F (36.4 C), temperature source Oral, resp. rate 22, height '5\' 2"'$  (1.575 m), weight 50.259 kg (110 lb 12.8 oz), SpO2 94 %.  PHYSICAL EXAMINATION:   GENERAL:  80 y.o.-year-old patient lying in the bed in moderate to severe respiratory distress, tachypneic, retracting her ribs/ supraclavicular area.  EYES:  Pupils equal, round, reactive to light and accommodation. No scleral icterus. Extraocular muscles intact.  HEENT: Head atraumatic, normocephalic. Oropharynx and nasopharynx clear.  NECK:  Supple, no jugular venous distention. No thyroid enlargement, no tenderness.  LUNGS: Markedly diminished breath sounds bilaterally, no wheezing, rales,rhonchi or crepitation. Using accessory muscles of respiration.  CARDIOVASCULAR: S1, S2 normal. No murmurs, rubs, or gallops.  ABDOMEN: Soft, nontender, nondistended. Bowel sounds present. No organomegaly or mass.  EXTREMITIES: 1+ lower extremity and pedal edema, no cyanosis, or clubbing.  NEUROLOGIC: Cranial nerves II through XII are intact. Muscle strength 5/5 in all extremities. Sensation intact. Gait not checked.  PSYCHIATRIC: The patient is alert and oriented x 3.  SKIN: No obvious rash, lesion, or ulcer.   ORDERS/RESULTS REVIEWED:   CBC  Recent Labs Lab 08/09/15 1204 08/10/15 0454  WBC 7.5 6.6  HGB 13.4 13.2  HCT 40.4 40.2  PLT 256 231  MCV 91.8 93.8  MCH 30.4 30.8  MCHC 33.1 32.8  RDW 14.5 14.4  LYMPHSABS 0.7*  --   MONOABS 0.5  --   EOSABS 0.1  --   BASOSABS 0.0  --    ------------------------------------------------------------------------------------------------------------------  Chemistries   Recent Labs Lab 08/09/15 1204 08/10/15 0454  NA 138 140  K 4.2 3.7  CL 99* 98*  CO2 31 35*  GLUCOSE 107* 87  BUN 14 13  CREATININE 0.57 0.54  CALCIUM 9.1 8.9  AST 37  --   ALT 27  --   ALKPHOS 66  --   BILITOT 1.0  --    ------------------------------------------------------------------------------------------------------------------ estimated creatinine clearance is 45.8 mL/min (by C-G formula based on Cr of 0.54). ------------------------------------------------------------------------------------------------------------------  Recent Labs  08/09/15 1204  TSH 2.045    Cardiac  Enzymes  Recent Labs Lab 08/09/15 1204   TROPONINI <0.03   ------------------------------------------------------------------------------------------------------------------ Invalid input(s): POCBNP ---------------------------------------------------------------------------------------------------------------  RADIOLOGY: US Venous Img Lower Bilateral  08/10/2015  CLINICAL DATA:  Lower extremity swelling. EXAM: BILATERAL LOWER EXTREMITY VENOUS DOPPLER ULTRASOUND TECHNIQUE: Gray-scale sonography with graded compression, as well as color Doppler and duplex ultrasound were performed to evaluate the lower extremity deep venous systems from the level of the common femoral vein and including the common femoral, femoral, profunda femoral, popliteal and calf veins including the posterior tibial, peroneal and gastrocnemius veins when visible. The superficial great saphenous vein was also interrogated. Spectral Doppler was utilized to evaluate flow at rest and with distal augmentation maneuvers in the common femoral, femoral and popliteal veins. COMPARISON:  None. FINDINGS: RIGHT LOWER EXTREMITY Normal compressibility, augmentation and color Doppler flow in the right common femoral vein, right femoral vein and right popliteal vein. The right saphenofemoral junction is patent. Right profunda femoral vein is patent without thrombus. Visualized right deep calf veins are patent without thrombus. LEFT LOWER EXTREMITY Normal compressibility, augmentation and color Doppler flow in the left common femoral vein, left femoral vein and left popliteal vein. The left saphenofemoral junction is patent. Left profunda femoral vein is patent without thrombus. Visualized left deep calf veins are patent without thrombus. Other Findings:  None. IMPRESSION: No evidence of deep venous thrombosis in the lower extremities. Electronically Signed   By: Markus Daft M.D.   On: 08/10/2015 15:28   Dg Chest Port 1 View  08/10/2015  CLINICAL DATA:  Chronic shortness-of-breath worsening  today. EXAM: PORTABLE CHEST 1 VIEW COMPARISON:  08/09/2015 FINDINGS: Lungs are hyperexpanded with persistent bibasilar opacification compatible with small effusions and associated atelectasis without significant change. Cannot completely exclude infection in the lung bases. Stable cardiomegaly. Mild calcified plaque over the aortic arch. Remainder of the exam is unchanged. IMPRESSION: Stable small bilateral pleural effusions likely with associated basilar atelectasis. Cannot exclude infection in the lung bases. Stable cardiomegaly. Electronically Signed   By: Marin Olp M.D.   On: 08/10/2015 11:02   Dg Chest Portable 1 View  08/09/2015  CLINICAL DATA:  Short of breath.  History COPD and emphysema. EXAM: PORTABLE CHEST 1 VIEW COMPARISON:  None. FINDINGS: Hyperinflation. Midline trachea. Mild cardiomegaly. Small bilateral pleural effusions. No pneumothorax. Pulmonary interstitial prominence. Mild. Patchy bibasilar airspace disease. IMPRESSION: Congestive heart failure with bilateral pleural effusions. Bibasilar airspace disease, favoring atelectasis. Hyperinflation, consistent with COPD Electronically Signed   By: Abigail Miyamoto M.D.   On: 08/09/2015 12:35    EKG: No orders found for this or any previous visit.  ASSESSMENT AND PLAN:  Active Problems:   CHF exacerbation ([place]) #1. Acute on chronic respiratory failure with hypoxia, patient is on 3 L of oxygen through nasal cannula at present, on 2 L at home, patient's acute history failure was very likely related to COPD exacerbation as well as possibly acute CHF, initiate patient on budesonide, advance due next every 4 hours around the clock, follow clinically, continue diuretic and low-dose watching. Ins and outs. Patient's d-dimer is elevated at more than 700s, get VQ scan #2. Acute CHF, echocardiogram is  pending, continue diuretic and low-dose #3. COPD exacerbation due to questionable pneumonia, initiate patient on antibiotics, continue budesonide and  DuoNeb nebs #4. Questionable pneumonia, initiate patient on levofloxacin, get sputum culture if possible    Management plans discussed with the patient, family and they are in agreement.   DRUG ALLERGIES: No Known Allergies  CODE STATUS:  Code Status Orders        Start     Ordered   08/09/15 1646  Full code   Continuous     08/09/15 1645    Code Status History    Date Active Date Inactive Code Status Order ID Comments User Context   This patient has a current code status but no historical code status.    Advance Directive Documentation        Most Recent Value   Type of Advance Directive  Healthcare Power of Attorney, Living will   Pre-existing out of facility DNR order (yellow form or pink MOST form)     "MOST" Form in Place?        TOTALCritical care  TIME TAKING CARE OF THIS PATIENT: 40  minutes.    Theodoro Grist M.D on 08/10/2015 at 5:01 PM  Between 7am to 6pm - Pager - [phone]  After 6pm go to [email or web address] - password [email or web address] [facility]  [facility]  Office  [phone]  CC: Primary care physician; [facility], Chrissie Noa, MD

## 2015-08-10 NOTE — Care Management (Signed)
Presents from home with a history of COPD and chronic O2 though [facility]. Admitted with shortness of breath. New diagnosis of CHF. PCP is Dr. Ellison Hughs at [facility].  Patient states she does not have scales but states her spouse if bringing her some today.Discussed tele health program through [facility]. Patient is agreeable. Her spouse is her primary caregiver.

## 2015-08-10 NOTE — Progress Notes (Signed)
*  PRELIMINARY RESULTS* Echocardiogram 2D Echocardiogram has been performed.  Laqueta Jean Hege 08/10/2015, 9:03 AM

## 2015-08-11 LAB — BASIC METABOLIC PANEL
Anion gap: 6 (ref 5–15)
BUN: 11 mg/dL (ref 6–20)
CHLORIDE: 94 mmol/L — AB (ref 101–111)
CO2: 38 mmol/L — ABNORMAL HIGH (ref 22–32)
CREATININE: 0.48 mg/dL (ref 0.44–1.00)
Calcium: 9.3 mg/dL (ref 8.9–10.3)
Glucose, Bld: 97 mg/dL (ref 65–99)
POTASSIUM: 3.8 mmol/L (ref 3.5–5.1)
SODIUM: 138 mmol/L (ref 135–145)

## 2015-08-11 MED ORDER — FUROSEMIDE 20 MG PO TABS
20.0000 mg | ORAL_TABLET | Freq: Every day | ORAL | Status: DC
  Administered 2015-08-11 – 2015-08-12 (×2): 20 mg via ORAL
  Filled 2015-08-11 (×2): qty 1

## 2015-08-11 MED ORDER — ASPIRIN 81 MG PO CHEW
81.0000 mg | CHEWABLE_TABLET | Freq: Every day | ORAL | Status: DC
  Administered 2015-08-12 – 2015-08-13 (×2): 81 mg via ORAL
  Filled 2015-08-11 (×2): qty 1

## 2015-08-11 MED ORDER — NEBIVOLOL HCL 5 MG PO TABS
5.0000 mg | ORAL_TABLET | Freq: Every day | ORAL | Status: DC
  Administered 2015-08-12: 5 mg via ORAL
  Filled 2015-08-11: qty 1

## 2015-08-11 MED ORDER — MONTELUKAST SODIUM 10 MG PO TABS
10.0000 mg | ORAL_TABLET | Freq: Every day | ORAL | Status: DC
  Administered 2015-08-12 – 2015-08-13 (×2): 10 mg via ORAL
  Filled 2015-08-11 (×2): qty 1

## 2015-08-11 MED ORDER — BUDESONIDE 0.25 MG/2ML IN SUSP
RESPIRATORY_TRACT | Status: AC
  Filled 2015-08-11: qty 2

## 2015-08-11 NOTE — Progress Notes (Signed)
Physical Therapy Treatment Patient Details Name: Brooke Trevino MRN: [id number] DOB: Jul 14, 1935 Today's Date: 08/11/2015    History of Present Illness Brooke Trevino  is a 80 y.o. female with a known history of COPD who presents with increasing shortness of breath for the past few days. She also reports PND, orthopnea and lower extremity edema. She is not taking Lasix as she thinks this is drying her skin out. Chest x-ray in the emergency room shows new congestive heart failure and atelectasis. She has been given 49 g IV Lasix. No falls in the last 12 months.     PT Comments    Patient reports feeling much better on this date and agrees to attempt further distance ambulation with PT. She appears somewhat depressed about her decreased mobility initially, but appears rather optimistic by the conclusion of the session. She would benefit from HHPT eval as she is limited in her tolerance for mobility currently to very short distances with substantial rest breaks required to regain O2 sats. Home set up may be appropriate with pacing strategies discussed in acute setting today. Overall significant improvement in mobility today from previous session.   Follow Up Recommendations  Home health PT (Progress to Pulmonary rehab at later date)     Equipment Recommendations       Recommendations for Other Services       Precautions / Restrictions Precautions Precautions: Fall Restrictions Weight Bearing Restrictions: No    Mobility  Bed Mobility Overal bed mobility: Independent             General bed mobility comments: No deficits noted.   Transfers Overall transfer level: Needs assistance Equipment used: Rolling walker (2 wheeled) Transfers: Sit to/from Stand Sit to Stand: Supervision         General transfer comment: Appropriate hand placement, sequencing with transfer, no loss of balance.   Ambulation/Gait Ambulation/Gait assistance: Supervision Ambulation Distance  (Feet):  (3 bouts - 1st 29f, second 60', 3rd - 100') Assistive device: Rolling walker (2 wheeled) Gait Pattern/deviations: Step-through pattern;Decreased step length - right;Decreased step length - left   Gait velocity interpretation: Below normal speed for age/gender General Gait Details: Patient performed 3 separate bouts of ambulation, all on 3L of O2. She desaturated to 83-85% after each bout, returned within 2 minutes to her baseline of 92-96% on all bouts (did increase temporarily to 4L after 3rd bout due to prolonged time to recover).    Stairs            Wheelchair Mobility    Modified Rankin (Stroke Patients Only)       Balance Overall balance assessment: Needs assistance Sitting-balance support: No upper extremity supported Sitting balance-Leahy Scale: Good     Standing balance support: Bilateral upper extremity supported Standing balance-Leahy Scale: Good                      Cognition Arousal/Alertness: Awake/alert Behavior During Therapy: WFL for tasks assessed/performed Overall Cognitive Status: Within Functional Limits for tasks assessed                      Exercises      General Comments        Pertinent Vitals/Pain Pain Assessment: No/denies pain    Home Living                      Prior Function            PT  Goals (current goals can now be found in the care plan section) Acute Rehab PT Goals Patient Stated Goal: "Improve her breathing so she can play with her grandchildren." PT Goal Formulation: With patient Time For Goal Achievement: 08/24/15 Potential to Achieve Goals: Good Progress towards PT goals: Progressing toward goals    Frequency  Min 2X/week    PT Plan Discharge plan needs to be updated    Co-evaluation             End of Session Equipment Utilized During Treatment: Gait belt;Oxygen Activity Tolerance: Patient tolerated treatment well;Patient limited by fatigue Patient left: in  chair;with call bell/phone within reach;with chair alarm set     Time: 1425-1455 PT Time Calculation (min) (ACUTE ONLY): 30 min  Charges:  $Gait Training: 23-37 mins                    G Codes:      Kerman Passey, PT, DPT    08/11/2015, 5:22 PM

## 2015-08-11 NOTE — Discharge Instructions (Signed)
Heart Failure Clinic appointment on August 17, 2015 at 11:00am with Darylene Price, [place]. Please call [phone] to reschedule.

## 2015-08-11 NOTE — Progress Notes (Signed)
[place] at [place] NAME: Brooke Trevino    MR#:  [id number]  DATE OF BIRTH:  02/27/1936  SUBJECTIVE:  CHIEF COMPLAINT:   Chief Complaint  Patient presents with  . Shortness of Breath  Patient is 80 year old Caucasian female with medical history significant for history of history of COPD,  who presents to the hospital with complaints of increasing shortness of breath, lower extremity swelling, PND. In emergency room, chest x-ray revealed congestive heart failure, atelectasis. She was given  Lasix And diuresed about 2.2 L since admission, she was initiated on antibiotic therapy, nebulizers, she feels improved today  Review of Systems  Constitutional: Negative for fever, chills and weight loss.  HENT: Negative for congestion.   Eyes: Negative for blurred vision and double vision.  Respiratory: Positive for shortness of breath. Negative for cough, sputum production and wheezing.   Cardiovascular: Positive for palpitations, leg swelling and PND. Negative for chest pain and orthopnea.  Gastrointestinal: Negative for nausea, vomiting, abdominal pain, diarrhea, constipation and blood in stool.  Genitourinary: Negative for dysuria, urgency, frequency and hematuria.  Musculoskeletal: Negative for falls.  Neurological: Negative for dizziness, tremors, focal weakness and headaches.  Endo/Heme/Allergies: Does not bruise/bleed easily.  Psychiatric/Behavioral: Negative for depression. The patient does not have insomnia.     VITAL SIGNS: Blood pressure 111/71, pulse 81, temperature 98.5 F (36.9 C), temperature source Oral, resp. rate 20, height '5\' 2"'$  (1.575 m), weight 50.259 kg (110 lb 12.8 oz), SpO2 99 %.  PHYSICAL EXAMINATION:   GENERAL:  80 y.o.-year-old patient lying in the bed in mild respiratory distress. Tachypneic whenever starts talking, able to speak short sentences EYES: Pupils equal, round, reactive to light and accommodation. No  scleral icterus. Extraocular muscles intact.  HEENT: Head atraumatic, normocephalic. Oropharynx and nasopharynx clear.  NECK:  Supple, no jugular venous distention. No thyroid enlargement, no tenderness.  LUNGS: Better air entrance bilaterally, intermittent wheezing, especially in right posterior lung field, overall diminished breath sounds bilaterally, no rales,rhonchi or crepitation. Using accessory muscles of respiration. Intermittently, especially with speech and moving around.  CARDIOVASCULAR: S1, S2 normal. No murmurs, rubs, or gallops.  ABDOMEN: Soft, nontender, nondistended. Bowel sounds present. No organomegaly or mass.  EXTREMITIES: 1+ lower extremity and pedal edema, no cyanosis, or clubbing.  NEUROLOGIC: Cranial nerves II through XII are intact. Muscle strength 5/5 in all extremities. Sensation intact. Gait not checked.  PSYCHIATRIC: The patient is alert and oriented x 3.  SKIN: No obvious rash, lesion, or ulcer.   ORDERS/RESULTS REVIEWED:   CBC  Recent Labs Lab 08/09/15 1204 08/10/15 0454  WBC 7.5 6.6  HGB 13.4 13.2  HCT 40.4 40.2  PLT 256 231  MCV 91.8 93.8  MCH 30.4 30.8  MCHC 33.1 32.8  RDW 14.5 14.4  LYMPHSABS 0.7*  --   MONOABS 0.5  --   EOSABS 0.1  --   BASOSABS 0.0  --    ------------------------------------------------------------------------------------------------------------------  Chemistries   Recent Labs Lab 08/09/15 1204 08/10/15 0454 08/11/15 0412  NA 138 140 138  K 4.2 3.7 3.8  CL 99* 98* 94*  CO2 31 35* 38*  GLUCOSE 107* 87 97  BUN '14 13 11  '$ CREATININE 0.57 0.54 0.48  CALCIUM 9.1 8.9 9.3  AST 37  --   --   ALT 27  --   --   ALKPHOS 66  --   --   BILITOT 1.0  --   --    ------------------------------------------------------------------------------------------------------------------ estimated  creatinine clearance is 45.8 mL/min (by C-G formula based on Cr of  0.48). ------------------------------------------------------------------------------------------------------------------  Recent Labs  08/09/15 1204  TSH 2.045    Cardiac Enzymes  Recent Labs Lab 08/09/15 1204  TROPONINI <0.03   ------------------------------------------------------------------------------------------------------------------ Invalid input(s): POCBNP ---------------------------------------------------------------------------------------------------------------  RADIOLOGY: US Venous Img Lower Bilateral  08/10/2015  CLINICAL DATA:  Lower extremity swelling. EXAM: BILATERAL LOWER EXTREMITY VENOUS DOPPLER ULTRASOUND TECHNIQUE: Gray-scale sonography with graded compression, as well as color Doppler and duplex ultrasound were performed to evaluate the lower extremity deep venous systems from the level of the common femoral vein and including the common femoral, femoral, profunda femoral, popliteal and calf veins including the posterior tibial, peroneal and gastrocnemius veins when visible. The superficial great saphenous vein was also interrogated. Spectral Doppler was utilized to evaluate flow at rest and with distal augmentation maneuvers in the common femoral, femoral and popliteal veins. COMPARISON:  None. FINDINGS: RIGHT LOWER EXTREMITY Normal compressibility, augmentation and color Doppler flow in the right common femoral vein, right femoral vein and right popliteal vein. The right saphenofemoral junction is patent. Right profunda femoral vein is patent without thrombus. Visualized right deep calf veins are patent without thrombus. LEFT LOWER EXTREMITY Normal compressibility, augmentation and color Doppler flow in the left common femoral vein, left femoral vein and left popliteal vein. The left saphenofemoral junction is patent. Left profunda femoral vein is patent without thrombus. Visualized left deep calf veins are patent without thrombus. Other Findings:  None. IMPRESSION:  No evidence of deep venous thrombosis in the lower extremities. Electronically Signed   By: Markus Daft M.D.   On: 08/10/2015 15:28   Dg Chest Port 1 View  08/10/2015  CLINICAL DATA:  Chronic shortness-of-breath worsening today. EXAM: PORTABLE CHEST 1 VIEW COMPARISON:  08/09/2015 FINDINGS: Lungs are hyperexpanded with persistent bibasilar opacification compatible with small effusions and associated atelectasis without significant change. Cannot completely exclude infection in the lung bases. Stable cardiomegaly. Mild calcified plaque over the aortic arch. Remainder of the exam is unchanged. IMPRESSION: Stable small bilateral pleural effusions likely with associated basilar atelectasis. Cannot exclude infection in the lung bases. Stable cardiomegaly. Electronically Signed   By: Marin Olp M.D.   On: 08/10/2015 11:02    EKG: No orders found for this or any previous visit.  ASSESSMENT AND PLAN:  Active Problems:   CHF exacerbation ([place]) #1. Acute on chronic respiratory failure with hypoxia due to acute CHF and COPD exacerbation, patient remains on 3 L of oxygen through nasal cannula at present, on 2 L at home, continue low-dose Lasix orally, watching ins and outs, continue budesonide, DuoNeb nebs.  #2. Acute CHF, echocardiogram is  pending, continue diuretic at low-dose orally, following ins and outs #3. COPD exacerbation due to pneumonia, continue levofloxacin, get sputum culture if possible, continue budesonide and DuoNeb nebs, patient has improved, however, she is wheezing today, likely due to better opening of airways. #4. Basilar pneumonia, continue patient on levofloxacin, get sputum culture if possible  #5. Lower extremity swelling, no DVT in lower extremities on ultrasound, continue DVT prophylaxis  Management plans discussed with the patient, family and they are in agreement.   DRUG ALLERGIES: No Known Allergies  CODE STATUS:     Code Status Orders        Start     Ordered    08/09/15 1646  Full code   Continuous     08/09/15 1645    Code Status History    Date Active Date Inactive Code Status Order  ID Comments User Context   This patient has a current code status but no historical code status.    Advance Directive Documentation        Most Recent Value   Type of Advance Directive  Healthcare Power of Attorney, Living will   Pre-existing out of facility DNR order (yellow form or pink MOST form)     "MOST" Form in Place?        TOTAL TIME TAKING CARE OF THIS PATIENT: 40  minutes.    Theodoro Grist M.D on 08/11/2015 at 5:17 PM  Between 7am to 6pm - Pager - [phone]  After 6pm go to [email or web address] - password [email or web address]  [facility]  Office  [phone]  CC: Primary care physician; [facility], Chrissie Noa, MD

## 2015-08-11 NOTE — Progress Notes (Signed)
Initial Heart Failure Clinic appointment scheduled for August 17, 2015 at 11:00am. Thank you.

## 2015-08-12 LAB — ECHOCARDIOGRAM COMPLETE
HEIGHTINCHES: 62 in
Weight: 1888 oz

## 2015-08-12 MED ORDER — LEVOFLOXACIN 500 MG PO TABS
750.0000 mg | ORAL_TABLET | ORAL | Status: DC
  Administered 2015-08-13: 750 mg via ORAL
  Filled 2015-08-12: qty 2

## 2015-08-12 MED ORDER — CARVEDILOL 6.25 MG PO TABS
6.2500 mg | ORAL_TABLET | Freq: Two times a day (BID) | ORAL | Status: DC
  Administered 2015-08-12 – 2015-08-13 (×2): 6.25 mg via ORAL
  Filled 2015-08-12 (×2): qty 1

## 2015-08-12 MED ORDER — METHYLPREDNISOLONE SODIUM SUCC 125 MG IJ SOLR
60.0000 mg | INTRAMUSCULAR | Status: DC
  Administered 2015-08-12: 60 mg via INTRAVENOUS
  Filled 2015-08-12: qty 2

## 2015-08-12 MED ORDER — LISINOPRIL 5 MG PO TABS
5.0000 mg | ORAL_TABLET | Freq: Every day | ORAL | Status: DC
  Administered 2015-08-12 – 2015-08-13 (×2): 5 mg via ORAL
  Filled 2015-08-12 (×2): qty 1

## 2015-08-12 MED ORDER — FUROSEMIDE 10 MG/ML IJ SOLN
20.0000 mg | Freq: Two times a day (BID) | INTRAMUSCULAR | Status: DC
  Administered 2015-08-12 – 2015-08-13 (×3): 20 mg via INTRAVENOUS
  Filled 2015-08-12 (×3): qty 2

## 2015-08-12 MED ORDER — FUROSEMIDE 10 MG/ML IJ SOLN
20.0000 mg | Freq: Once | INTRAMUSCULAR | Status: AC
  Administered 2015-08-12: 20 mg via INTRAVENOUS
  Filled 2015-08-12: qty 2

## 2015-08-12 NOTE — Progress Notes (Signed)
[place] at [place] NAME: Brooke Trevino    MR#:  [id number]  DATE OF BIRTH:  1935-08-31  SUBJECTIVE:  CHIEF COMPLAINT:   Chief Complaint  Patient presents with  . Shortness of Breath  Patient is 80 year old Caucasian female with medical history significant for history of history of COPD,  who presents to the hospital with complaints of increasing shortness of breath, lower extremity swelling, PND. In emergency room, chest x-ray revealed congestive heart failure, atelectasis. She was given  Lasix And diuresed about 2.9 L since admission, she was initiated on antibiotic therapy, nebulizers, she feels improved todayBut not as good as she felt yesterday  Patient was seen by cardiologist  yesterday, appreciate recommendations. Echocardiogram revealed cardiomyopathy with ejection fraction of 20-25%, Coreg and lisinopril were recommended by Dr. Clayborn Bigness.     Review of Systems  Constitutional: Negative for fever, chills and weight loss.  HENT: Negative for congestion.   Eyes: Negative for blurred vision and double vision.  Respiratory: Positive for shortness of breath. Negative for cough, sputum production and wheezing.   Cardiovascular: Positive for palpitations, leg swelling and PND. Negative for chest pain and orthopnea.  Gastrointestinal: Negative for nausea, vomiting, abdominal pain, diarrhea, constipation and blood in stool.  Genitourinary: Negative for dysuria, urgency, frequency and hematuria.  Musculoskeletal: Negative for falls.  Neurological: Negative for dizziness, tremors, focal weakness and headaches.  Endo/Heme/Allergies: Does not bruise/bleed easily.  Psychiatric/Behavioral: Negative for depression. The patient does not have insomnia.     VITAL SIGNS: Blood pressure 125/78, pulse 86, temperature 98.3 F (36.8 C), temperature source Oral, resp. rate 18, height '5\' 2"'$  (1.575 m), weight 50.259 kg (110 lb 12.8 oz), SpO2 94  %.  PHYSICAL EXAMINATION:   GENERAL:  80 y.o.-year-old patient lying in the bed in mild respiratory distress. Tachypneic whenever starts talking, able to speak short sentences EYES: Pupils equal, round, reactive to light and accommodation. No scleral icterus. Extraocular muscles intact.  HEENT: Head atraumatic, normocephalic. Oropharynx and nasopharynx clear.  NECK:  K venous distention. No thyroid enlargement, no tenderness.  LUNGS: Crackles in posterior bases , inspiratory and expiratory  wheezing,  diminished breath sounds bilaterally, no rales,rhonchi or crepitation. Using accessory muscles of respiration. Intermittently, especially with speech and moving around.  CARDIOVASCULAR: S1, S2 normal. No murmurs, rubs, or gallops.  ABDOMEN: Soft, nontender, nondistended. Bowel sounds present. No organomegaly or mass.  EXTREMITIES: 1+ lower extremity and pedal edema, no cyanosis, or clubbing.  NEUROLOGIC: Cranial nerves II through XII are intact. Muscle strength 5/5 in all extremities. Sensation intact. Gait not checked.  PSYCHIATRIC: The patient is alert and oriented x 3.  SKIN: No obvious rash, lesion, or ulcer.   ORDERS/RESULTS REVIEWED:   CBC  Recent Labs Lab 08/09/15 1204 08/10/15 0454  WBC 7.5 6.6  HGB 13.4 13.2  HCT 40.4 40.2  PLT 256 231  MCV 91.8 93.8  MCH 30.4 30.8  MCHC 33.1 32.8  RDW 14.5 14.4  LYMPHSABS 0.7*  --   MONOABS 0.5  --   EOSABS 0.1  --   BASOSABS 0.0  --    ------------------------------------------------------------------------------------------------------------------  Chemistries   Recent Labs Lab 08/09/15 1204 08/10/15 0454 08/11/15 0412  NA 138 140 138  K 4.2 3.7 3.8  CL 99* 98* 94*  CO2 31 35* 38*  GLUCOSE 107* 87 97  BUN '14 13 11  '$ CREATININE 0.57 0.54 0.48  CALCIUM 9.1 8.9 9.3  AST 37  --   --  ALT 27  --   --   ALKPHOS 66  --   --   BILITOT 1.0  --   --     ------------------------------------------------------------------------------------------------------------------ estimated creatinine clearance is 45.8 mL/min (by C-G formula based on Cr of 0.48). ------------------------------------------------------------------------------------------------------------------ No results for input(s): TSH, T4TOTAL, T3FREE, THYROIDAB in the last 72 hours.  Invalid input(s): FREET3  Cardiac Enzymes  Recent Labs Lab 08/09/15 1204  TROPONINI <0.03   ------------------------------------------------------------------------------------------------------------------ Invalid input(s): POCBNP ---------------------------------------------------------------------------------------------------------------  RADIOLOGY: No results found.  EKG: No orders found for this or any previous visit.  ASSESSMENT AND PLAN:  Active Problems:   CHF exacerbation ([place]) #1. Acute on chronic respiratory failure with hypoxia due to acute Systolic CHF and COPD exacerbation, patient is on 4 L of oxygen through nasal cannula at present, on 2 L at home, continue  Lasix intravenously, watching ins and outs, continue budesonide, DuoNeb nebs.  #2. Acute systolic CHF, echocardiogram revealed ejection fraction at 20/25 percent, normal wall motion, continue Lasix, adding Coreg, lisinopril, following ins and outs, oxygenation  #3. COPD exacerbation due to pneumonia, continue levofloxacin, get sputum culture if possible, continue budesonide and DuoNeb nebs, patient has not  improved significantly, initiate patient on steroids, continue inhalation therapy  #4. Basilar pneumonia, continue patient on levofloxacin, get sputum culture if possible  #5. Lower extremity swelling, no DVT in lower extremities on ultrasound, continue DVT prophylaxis, Lasix  Management plans discussed with the patient, family and they are in agreement.   DRUG ALLERGIES: No Known Allergies  CODE STATUS:     Code  Status Orders        Start     Ordered   08/09/15 1646  Full code   Continuous     08/09/15 1645    Code Status History    Date Active Date Inactive Code Status Order ID Comments User Context   This patient has a current code status but no historical code status.    Advance Directive Documentation        Most Recent Value   Type of Advance Directive  Healthcare Power of Attorney, Living will   Pre-existing out of facility DNR order (yellow form or pink MOST form)     "MOST" Form in Place?        TOTAL TIME TAKING CARE OF THIS PATIENT: 40  minutes.    Theodoro Grist M.D on 08/12/2015 at 3:54 PM  Between 7am to 6pm - Pager - [phone]  After 6pm go to [email or web address] - password [email or web address] [facility]  [facility]  Office  [phone]  CC: Primary care physician; [facility], Chrissie Noa, MD

## 2015-08-12 NOTE — Consult Note (Signed)
Pharmacy Antibiotic Note  Brooke Trevino is a 80 y.o. female admitted on 08/09/2015 with possible PNA.  Pharmacy has been consulted for levofloxacin dosing.  Plan: levofloxacin '750mg'$  q 48 hours  Height: '5\' 2"'$  (157.5 cm) Weight: 110 lb 12.8 oz (50.259 kg) IBW/kg (Calculated) : 50.1  Temp (24hrs), Avg:97.7 F (36.5 C), Min:97.3 F (36.3 C), Max:98.3 F (36.8 C)   Recent Labs Lab 08/09/15 1204 08/10/15 0454 08/11/15 0412  WBC 7.5 6.6  --   CREATININE 0.57 0.54 0.48    Estimated Creatinine Clearance: 45.8 mL/min (by C-G formula based on Cr of 0.48).    No Known Allergies  Antimicrobials this admission: levofloxacin 5/30 >>    Dose adjustments this admission: 6/1 Patient transitioned to PO therapy.   Microbiology results:   Pharmacy will continue to monitor and adjust per consult.    MLS  08/12/2015 4:07 PM

## 2015-08-12 NOTE — Progress Notes (Signed)
Per Dr. Clayton Bibles, give 1800 dose of lasix at 2200 tonight to space total of '60mg'$  iv lasix out more evenly. Dr. Clayton Bibles also gave orders to insert foley overnight so patient can tolerate evening dose of lasix without getting out of bed constantly so patient can rest.

## 2015-08-12 NOTE — Progress Notes (Signed)
Less dyspnea on exertion noted.  Pt reports feeling better. Oxygen weaned to pt's baseline of 2LNC.  Tolerating without distress.

## 2015-08-13 DIAGNOSIS — I420 Dilated cardiomyopathy: Secondary | ICD-10-CM

## 2015-08-13 DIAGNOSIS — I350 Nonrheumatic aortic (valve) stenosis: Secondary | ICD-10-CM

## 2015-08-13 DIAGNOSIS — J9621 Acute and chronic respiratory failure with hypoxia: Secondary | ICD-10-CM

## 2015-08-13 DIAGNOSIS — M7989 Other specified soft tissue disorders: Secondary | ICD-10-CM

## 2015-08-13 DIAGNOSIS — J441 Chronic obstructive pulmonary disease with (acute) exacerbation: Secondary | ICD-10-CM

## 2015-08-13 DIAGNOSIS — J189 Pneumonia, unspecified organism: Secondary | ICD-10-CM

## 2015-08-13 LAB — BASIC METABOLIC PANEL
Anion gap: 10 (ref 5–15)
BUN: 14 mg/dL (ref 6–20)
CALCIUM: 8.6 mg/dL — AB (ref 8.9–10.3)
CO2: 36 mmol/L — AB (ref 22–32)
CREATININE: 0.51 mg/dL (ref 0.44–1.00)
Chloride: 92 mmol/L — ABNORMAL LOW (ref 101–111)
GFR calc Af Amer: >60 mL/min (ref >60)
GFR calc non Af Amer: >60 mL/min (ref >60)
GLUCOSE: 111 mg/dL — AB (ref 65–99)
Potassium: 4.2 mmol/L (ref 3.5–5.1)
Sodium: 138 mmol/L (ref 135–145)

## 2015-08-13 MED ORDER — FUROSEMIDE 40 MG PO TABS: 40.0000 mg | ORAL_TABLET | Freq: Two times a day (BID) | ORAL | Status: DC

## 2015-08-13 MED ORDER — PREDNISONE 10 MG (21) PO TBPK: 10.0000 mg | ORAL_TABLET | Freq: Every day | ORAL | Status: DC

## 2015-08-13 MED ORDER — OCUVITE-LUTEIN PO CAPS: 1.0000 | ORAL_CAPSULE | Freq: Two times a day (BID) | ORAL | Status: DC

## 2015-08-13 MED ORDER — LISINOPRIL 5 MG PO TABS: 5.0000 mg | ORAL_TABLET | Freq: Every day | ORAL | Status: DC

## 2015-08-13 MED ORDER — POTASSIUM CHLORIDE CRYS ER 20 MEQ PO TBCR: 20.0000 meq | EXTENDED_RELEASE_TABLET | Freq: Every day | ORAL | Status: DC

## 2015-08-13 MED ORDER — LEVOFLOXACIN 750 MG PO TABS: 750.0000 mg | ORAL_TABLET | ORAL | Status: DC

## 2015-08-13 MED ORDER — CARVEDILOL 6.25 MG PO TABS: 6.2500 mg | ORAL_TABLET | Freq: Two times a day (BID) | ORAL | Status: DC

## 2015-08-13 MED ORDER — LEVOFLOXACIN 500 MG PO TABS: 750.0000 mg | ORAL_TABLET | ORAL | Status: DC

## 2015-08-13 NOTE — Consult Note (Signed)
Pharmacy Antibiotic Note  Brooke Trevino is a 80 y.o. female admitted on 08/09/2015 with possible PNA.  Pharmacy has been consulted for levofloxacin dosing.  Plan: levofloxacin '750mg'$  q 48 hours.   Height: '5\' 2"'$  (157.5 cm) Weight: 110 lb 12.8 oz (50.259 kg) IBW/kg (Calculated) : 50.1  Temp (24hrs), Avg:98 F (36.7 C), Min:97.8 F (36.6 C), Max:98.3 F (36.8 C)   Recent Labs Lab 08/09/15 1204 08/10/15 0454 08/11/15 0412 08/13/15 0430  WBC 7.5 6.6  --   --   CREATININE 0.57 0.54 0.48 0.51    Estimated Creatinine Clearance: 45.8 mL/min (by C-G formula based on Cr of 0.51).    No Known Allergies  Antimicrobials this admission: levofloxacin 5/30 >>    Dose adjustments this admission: 6/1 Patient transitioned to PO therapy. 6/2-  (note -Do not give with multivitamin)  Microbiology results:   Pharmacy will continue to monitor and adjust per consult.    Chinita Greenland PharmD Clinical Pharmacist 08/13/2015

## 2015-08-13 NOTE — Progress Notes (Signed)
Pt. Discharged to home w/ hh pt and home o2 via wc. Discharge instructions and medication regimen reviewed at bedside with patient and husband. Both verbalize understanding of instructions and medication regimen. Prescription for prednisone included with d/c papers to bring to pharmacy where other prescriptions are waiting to be picked up. Patient also given incentive spirometer, demonstrated adequate technique and understanding of use. Patient assessment unchanged from this morning. TELE and IV discontinued per policy. Time spent reviewing discharge papers, approx. 20 minutes.

## 2015-08-13 NOTE — Progress Notes (Signed)
Speech Therapy Note: received a verbal request from MD to speak w/ pt re: a recent choking incident w/ meat during a meal. Reviewed chart and met w/ pt who described this incident as a "one time only event" d/t lengthy chewing on a piece of tougher meat. She described her respiratory status as weakened currently which may have played a role she thought. She did not feel the need for an evaluation of her swallowing. SLP sat w/ pt and discussed the connection of swallowing and breathing as well as general aspiration precautions to include smaller bites and rest breaks if fatigued during a meal. Pt agreed, NSG agreed. Also suggested an incentive spirometer for pt and described such to her; pt readily agreed. MD contacted for order: NSG to f/u w/ providing it /bf pt discharges. No further skilled ST needs indicated at this time. Pt to f/u w/ her primary MD if any further concerns regarding swallowing; pt discharging home this PM. NSG updated.

## 2015-08-13 NOTE — Care Management (Signed)
Patient to be discharged home today.  Patient has chronic O2 and husband to bring portable O2 tank.  MD has ordered home health RN, PT, and tele health.  Referral has already been made to [place] [place].  I have notified Leina from [facility] of discharge.  RNCM signing off.

## 2015-08-13 NOTE — Discharge Summary (Signed)
[name] NAME: Brooke Trevino    MR#:  [id number]  DATE OF BIRTH:  1935/04/07  DATE OF ADMISSION:  08/09/2015 ADMITTING PHYSICIAN: Bettey Costa, MD  DATE OF DISCHARGE: 08/13/2015  2:48 PM  PRIMARY CARE PHYSICIAN: FELDPAUSCH, DALE E, MD     ADMISSION DIAGNOSIS:  Dyspnea [R06.00] Hypoxia [R09.02] Acute combined systolic and diastolic congestive heart failure (HCC) [I50.41] Chronic obstructive pulmonary disease with acute exacerbation (HCC) [J44.1]  DISCHARGE DIAGNOSIS:  Principal Problem:   Acute on chronic respiratory failure with hypoxia (HCC) Active Problems:   Acute systolic CHF (congestive heart failure) (HCC)   COPD exacerbation (HCC)   Cardiomyopathy (HCC)   Pneumonia   Aortic stenosis   Leg swelling   SECONDARY DIAGNOSIS:   Past Medical History  Diagnosis Date  . History of COPD   . Emphysema lung ([place])     .pro HOSPITAL COURSE:   Patient is 80 year old Caucasian female with medical history significant for history of history of chronic respiratory failure, COPD, who presented to the hospital with complaints of increasing shortness of breath, lower extremity swelling, PND. In emergency room, chest x-ray revealed congestive heart failure, atelectasis. She was given Lasix intravenously and diuresed about 3.9 L since admission, she also was initiated on antibiotic therapy, nebulizers and  Improved. Patient was seen by cardiologist. Echocardiogram revealed cardiomyopathy with ejection fraction of 20-25%, severe aortic stenosis and insufficiency. Patient was started on Coreg and lisinopril and was felt to be stable to be discharged home. Discussion by problem: #1. Acute on chronic respiratory failure with hypoxia due to acute Systolic CHF and mild COPD exacerbation, patient was weaned off to 3 liters of oxygen through nasal cannulas prior to leaving hospital, she was on 2 L at home, she was recommended to  continue Lasix orally twice daily watching weight, urinary output, leg swelling, follow up with cardiologist as outpatient within one week. She is also to  continue Advair, DuoNeb nebs, finish antibiotic course.  #2. Acute systolic CHF, echocardiogram revealed ejection fraction at 20/25 percent, normal wall motion, Aortic valve stenosis, continue Lasix, Coreg, lisinopril, following ins and outs, oxygenation, cardiology, CHF clinic follow up.   #3. COPD exacerbation due to pneumonia, continue levofloxacin, no sputum culture was obtained, continue Advair and DuoNeb nebs, taper  Steroids.  #4. Basilar pneumonia, continue patient on levofloxacin to finish course, we were unable to get sputum culture  #5. Lower extremity swelling, due to CHF, no DVT in lower extremities on Doppler ultrasound, continue  Lasix #6 Aortic valve stenosis, patient is to discuss with DR [facility] possible cardiac surgery referral. #7 cardiomyopathy, coreg, lasix, lisinopril, will need to start spironolactone as outpatient, CHF clinic, etc. #8 generalized weakness, HH PT, RN, telehealth  DISCHARGE CONDITIONS:   stable  CONSULTS OBTAINED:     DRUG ALLERGIES:  No Known Allergies  DISCHARGE MEDICATIONS:   Discharge Medication List as of 08/13/2015  1:37 PM    START taking these medications   Details  carvedilol (COREG) 6.25 MG tablet Take 1 tablet (6.25 mg total) by mouth 2 (two) times daily with a meal., Starting 08/13/2015, Until Discontinued, Normal    levofloxacin (LEVAQUIN) 750 MG tablet Take 1 tablet (750 mg total) by mouth every other day., Starting 08/15/2015, Until Discontinued, Normal    lisinopril (PRINIVIL,ZESTRIL) 5 MG tablet Take 1 tablet (5 mg total) by mouth daily., Starting 08/13/2015, Until Discontinued, Normal    potassium chloride SA (K-DUR,KLOR-CON) 20 MEQ tablet Take  1 tablet (20 mEq total) by mouth daily., Starting 08/13/2015, Until Discontinued, Normal    predniSONE (STERAPRED UNI-PAK 21 TAB) 10 MG  (21) TBPK tablet Take 1 tablet (10 mg total) by mouth daily. Please take 6 pills in the morning on the day one, then taper by 1 pill daily until finished, thank you, Starting 08/13/2015, Until Discontinued, Normal      CONTINUE these medications which have CHANGED   Details  furosemide (LASIX) 40 MG tablet Take 1 tablet (40 mg total) by mouth 2 (two) times daily., Starting 08/13/2015, Until Discontinued, Normal      CONTINUE these medications which have NOT CHANGED   Details  albuterol (PROVENTIL) (2.5 MG/3ML) 0.083% nebulizer solution Take 2.5 mg by nebulization every 4 (four) hours as needed for wheezing or shortness of breath., Until Discontinued, Historical Med    aspirin 81 MG chewable tablet Chew 81 mg by mouth at bedtime., Until Discontinued, Historical Med    calcium carbonate (TUMS - DOSED IN MG ELEMENTAL CALCIUM) 500 MG chewable tablet Chew 1 tablet by mouth as needed for indigestion or heartburn., Until Discontinued, Historical Med    citalopram (CELEXA) 10 MG tablet Take 10 mg by mouth daily., Until Discontinued, Historical Med    Fluticasone-Salmeterol (ADVAIR) 250-50 MCG/DOSE AEPB Inhale 1 puff into the lungs 2 (two) times daily., Until Discontinued, Historical Med    ipratropium-albuterol (DUONEB) 0.5-2.5 (3) MG/3ML SOLN Take 3 mLs by nebulization every 4 (four) hours., Until Discontinued, Historical Med    montelukast (SINGULAIR) 10 MG tablet Take 10 mg by mouth at bedtime., Until Discontinued, Historical Med    Multiple Vitamins-Minerals (PRESERVISION/LUTEIN) CAPS Take 1 capsule by mouth 2 (two) times daily., Until Discontinued, Historical Med    pravastatin (PRAVACHOL) 20 MG tablet Take 20 mg by mouth at bedtime., Until Discontinued, Historical Med    temazepam (RESTORIL) 30 MG capsule Take 30 mg by mouth at bedtime as needed for sleep., Until Discontinued, Historical Med      STOP taking these medications     meloxicam (MOBIC) 7.5 MG tablet      nebivolol (BYSTOLIC)  5 MG tablet          DISCHARGE INSTRUCTIONS:    Patient is to follow up with PCP, cardiology as outpatient  If you experience worsening of your admission symptoms, develop shortness of breath, life threatening emergency, suicidal or homicidal thoughts you must seek medical attention immediately by calling 911 or calling your MD immediately  if symptoms less severe.  You Must read complete instructions/literature along with all the possible adverse reactions/side effects for all the Medicines you take and that have been prescribed to you. Take any new Medicines after you have completely understood and accept all the possible adverse reactions/side effects.   Please note  You were cared for by a hospitalist during your hospital stay. If you have any questions about your discharge medications or the care you received while you were in the hospital after you are discharged, you can call the unit and asked to speak with the hospitalist on call if the hospitalist that took care of you is not available. Once you are discharged, your primary care physician will handle any further medical issues. Please note that NO REFILLS for any discharge medications will be authorized once you are discharged, as it is imperative that you return to your primary care physician (or establish a relationship with a primary care physician if you do not have one) for your aftercare needs so that  they can reassess your need for medications and monitor your lab values.    Today   CHIEF COMPLAINT:   Chief Complaint  Patient presents with  . Shortness of Breath    HISTORY OF PRESENT ILLNESS:  Brooke Trevino  is a 80 y.o. female with a known history of chronic respiratory failure, COPD, who presented to the hospital with complaints of increasing shortness of breath, lower extremity swelling, PND. In emergency room, chest x-ray revealed congestive heart failure, atelectasis. She was given Lasix intravenously and  diuresed about 3.9 L since admission, she also was initiated on antibiotic therapy, nebulizers and  Improved. Patient was seen by cardiologist. Echocardiogram revealed cardiomyopathy with ejection fraction of 20-25%, severe aortic stenosis and insufficiency. Patient was started on Coreg and lisinopril and was felt to be stable to be discharged home. Discussion by problem: #1. Acute on chronic respiratory failure with hypoxia due to acute Systolic CHF and mild COPD exacerbation, patient was weaned off to 3 liters of oxygen through nasal cannulas prior to leaving hospital, she was on 2 L at home, she was recommended to continue Lasix orally twice daily watching weight, urinary output, leg swelling, follow up with cardiologist as outpatient within one week. She is also to  continue Advair, DuoNeb nebs, finish antibiotic course.  #2. Acute systolic CHF, echocardiogram revealed ejection fraction at 20/25 percent, normal wall motion, Aortic valve stenosis, continue Lasix, Coreg, lisinopril, following ins and outs, oxygenation, cardiology, CHF clinic follow up.   #3. COPD exacerbation due to pneumonia, continue levofloxacin, no sputum culture was obtained, continue Advair and DuoNeb nebs, taper  Steroids.  #4. Basilar pneumonia, continue patient on levofloxacin to finish course, we were unable to get sputum culture  #5. Lower extremity swelling, due to CHF, no DVT in lower extremities on Doppler ultrasound, continue  Lasix #6 Aortic valve stenosis, patient is to discuss with DR [facility] possible cardiac surgery referral. #7 cardiomyopathy, coreg, lasix, lisinopril, will need to start spironolactone as outpatient, CHF clinic, etc. #8 generalized weakness, HH PT, RN, telehealth     VITAL SIGNS:  Blood pressure 132/75, pulse 69, temperature 98.2 F (36.8 C), temperature source Oral, resp. rate 19, height '5\' 2"'$  (1.575 m), weight 50.259 kg (110 lb 12.8 oz), SpO2 99 %.  I/O:   Intake/Output Summary  (Last 24 hours) at 08/13/15 2217 Last data filed at 08/13/15 1130  Gross per 24 hour  Intake    360 ml  Output   1425 ml  Net  -1065 ml    PHYSICAL EXAMINATION:  GENERAL:  80 y.o.-year-old patient lying in the bed with no acute distress.  EYES: Pupils equal, round, reactive to light and accommodation. No scleral icterus. Extraocular muscles intact.  HEENT: Head atraumatic, normocephalic. Oropharynx and nasopharynx clear.  NECK:  Supple, no jugular venous distention. No thyroid enlargement, no tenderness.  LUNGS: Normal breath sounds bilaterally, no wheezing, rales,rhonchi or crepitation. No use of accessory muscles of respiration.  CARDIOVASCULAR: S1, S2 normal. No murmurs, rubs, or gallops.  ABDOMEN: Soft, non-tender, non-distended. Bowel sounds present. No organomegaly or mass.  EXTREMITIES: No pedal edema, cyanosis, or clubbing.  NEUROLOGIC: Cranial nerves II through XII are intact. Muscle strength 5/5 in all extremities. Sensation intact. Gait not checked.  PSYCHIATRIC: The patient is alert and oriented x 3.  SKIN: No obvious rash, lesion, or ulcer.   DATA REVIEW:   CBC  Recent Labs Lab 08/10/15 0454  WBC 6.6  HGB 13.2  HCT 40.2  PLT 231  Chemistries   Recent Labs Lab 08/09/15 1204  08/13/15 0430  NA 138  < > 138  K 4.2  < > 4.2  CL 99*  < > 92*  CO2 31  < > 36*  GLUCOSE 107*  < > 111*  BUN 14  < > 14  CREATININE 0.57  < > 0.51  CALCIUM 9.1  < > 8.6*  AST 37  --   --   ALT 27  --   --   ALKPHOS 66  --   --   BILITOT 1.0  --   --   < > = values in this interval not displayed.  Cardiac Enzymes  Recent Labs Lab 08/09/15 [place] <0.03    Microbiology Results  No results found for this or any previous visit.  RADIOLOGY:  No results found.  EKG:  No orders found for this or any previous visit.    Management plans discussed with the patient, family and they are in agreement.  CODE STATUS:  Code Status History    Date Active Date  Inactive Code Status Order ID Comments User Context   08/09/2015  4:45 PM 08/13/2015  5:49 PM Full Code [id number]  Bettey Costa, MD ED    Advance Directive Documentation        Most Recent Value   Type of Advance Directive  Healthcare Power of [place], Living will   Pre-existing out of facility DNR order (yellow form or pink MOST form)     "MOST" Form in Place?        TOTAL TIME TAKING CARE OF THIS PATIENT: 40 minutes.    Theodoro Grist M.D on 08/13/2015 at 10:17 PM  Between 7am to 6pm - Pager - [phone]  After 6pm go to [email or web address] - password [email or web address] [facility]  [facility]  Office  [phone]  CC: Primary care physician; [facility], Chrissie Noa, MD

## 2015-08-17 ENCOUNTER — Ambulatory Visit: Payer: Medicare Other | Admitting: Family

## 2015-08-17 ENCOUNTER — Telehealth: Payer: Self-pay | Admitting: Family

## 2015-08-17 NOTE — Telephone Encounter (Signed)
Patient missed her initial appointment at the [facility] on 08/17/15. Will attempt to reschedule.

## 2015-09-20 ENCOUNTER — Other Ambulatory Visit: Payer: Self-pay | Admitting: Physical Medicine and Rehabilitation

## 2015-09-20 DIAGNOSIS — M5417 Radiculopathy, lumbosacral region: Secondary | ICD-10-CM

## 2015-10-06 ENCOUNTER — Ambulatory Visit
Admission: RE | Admit: 2015-10-06 | Discharge: 2015-10-06 | Disposition: A | Discharge status: Home / Self Care | Payer: Medicare Other | Source: Ambulatory Visit | Attending: Physical Medicine and Rehabilitation | Admitting: Physical Medicine and Rehabilitation

## 2015-10-06 ENCOUNTER — Encounter: Payer: Self-pay | Admitting: Radiology

## 2015-10-06 DIAGNOSIS — M5136 Other intervertebral disc degeneration, lumbar region: Secondary | ICD-10-CM | POA: Insufficient documentation

## 2015-10-06 DIAGNOSIS — M5416 Radiculopathy, lumbar region: Secondary | ICD-10-CM | POA: Insufficient documentation

## 2015-10-06 DIAGNOSIS — M5417 Radiculopathy, lumbosacral region: Secondary | ICD-10-CM

## 2015-10-06 DIAGNOSIS — M7138 Other bursal cyst, other site: Secondary | ICD-10-CM | POA: Insufficient documentation

## 2015-10-06 DIAGNOSIS — M2578 Osteophyte, vertebrae: Secondary | ICD-10-CM | POA: Insufficient documentation

## 2015-10-27 ENCOUNTER — Ambulatory Visit: Payer: Medicare Other

## 2015-11-25 ENCOUNTER — Other Ambulatory Visit: Payer: Self-pay | Admitting: Family Medicine

## 2015-11-25 DIAGNOSIS — R1011 Right upper quadrant pain: Secondary | ICD-10-CM

## 2015-11-30 ENCOUNTER — Ambulatory Visit
Admission: RE | Admit: 2015-11-30 | Discharge: 2015-11-30 | Disposition: A | Discharge status: Home / Self Care | Payer: Medicare Other | Source: Ambulatory Visit | Attending: Family Medicine | Admitting: Family Medicine

## 2015-11-30 DIAGNOSIS — Z9049 Acquired absence of other specified parts of digestive tract: Secondary | ICD-10-CM | POA: Diagnosis not present

## 2015-11-30 DIAGNOSIS — R1011 Right upper quadrant pain: Secondary | ICD-10-CM | POA: Insufficient documentation

## 2016-02-23 ENCOUNTER — Other Ambulatory Visit: Payer: Self-pay | Admitting: Family Medicine

## 2016-02-23 DIAGNOSIS — Z1231 Encounter for screening mammogram for malignant neoplasm of breast: Secondary | ICD-10-CM

## 2016-08-02 ENCOUNTER — Ambulatory Visit
Admission: RE | Admit: 2016-08-02 | Discharge: 2016-08-02 | Disposition: A | Discharge status: Home / Self Care | Payer: Medicare Other | Source: Ambulatory Visit | Attending: Family Medicine | Admitting: Family Medicine

## 2016-08-02 DIAGNOSIS — Z1231 Encounter for screening mammogram for malignant neoplasm of breast: Secondary | ICD-10-CM | POA: Insufficient documentation

## 2016-09-08 ENCOUNTER — Ambulatory Visit (INDEPENDENT_AMBULATORY_CARE_PROVIDER_SITE_OTHER): Payer: Medicare Other | Admitting: Vascular Surgery

## 2016-09-08 ENCOUNTER — Other Ambulatory Visit (INDEPENDENT_AMBULATORY_CARE_PROVIDER_SITE_OTHER): Payer: Medicare Other

## 2016-09-11 ENCOUNTER — Other Ambulatory Visit: Payer: Self-pay | Admitting: Specialist

## 2016-09-11 DIAGNOSIS — R918 Other nonspecific abnormal finding of lung field: Secondary | ICD-10-CM

## 2016-09-11 DIAGNOSIS — R042 Hemoptysis: Secondary | ICD-10-CM

## 2016-09-14 ENCOUNTER — Ambulatory Visit
Admission: RE | Admit: 2016-09-14 | Discharge: 2016-09-14 | Disposition: A | Discharge status: Home / Self Care | Payer: Medicare Other | Source: Ambulatory Visit | Attending: Specialist | Admitting: Specialist

## 2016-09-14 DIAGNOSIS — I700 Atherosclerosis of aorta: Secondary | ICD-10-CM | POA: Diagnosis not present

## 2016-09-14 DIAGNOSIS — R590 Localized enlarged lymph nodes: Secondary | ICD-10-CM | POA: Insufficient documentation

## 2016-09-14 DIAGNOSIS — R042 Hemoptysis: Secondary | ICD-10-CM | POA: Diagnosis not present

## 2016-09-14 DIAGNOSIS — I2510 Atherosclerotic heart disease of native coronary artery without angina pectoris: Secondary | ICD-10-CM | POA: Diagnosis not present

## 2016-09-14 DIAGNOSIS — R918 Other nonspecific abnormal finding of lung field: Secondary | ICD-10-CM | POA: Insufficient documentation

## 2016-09-14 DIAGNOSIS — I712 Thoracic aortic aneurysm, without rupture: Secondary | ICD-10-CM | POA: Insufficient documentation

## 2016-09-14 DIAGNOSIS — J439 Emphysema, unspecified: Secondary | ICD-10-CM | POA: Insufficient documentation

## 2016-09-18 ENCOUNTER — Ambulatory Visit: Payer: Medicare Other

## 2016-09-20 ENCOUNTER — Other Ambulatory Visit: Payer: Self-pay | Admitting: Specialist

## 2016-09-20 DIAGNOSIS — R918 Other nonspecific abnormal finding of lung field: Secondary | ICD-10-CM

## 2016-09-25 ENCOUNTER — Encounter
Admission: RE | Admit: 2016-09-25 | Discharge: 2016-09-25 | Disposition: A | Discharge status: Home / Self Care | Payer: Medicare Other | Source: Ambulatory Visit | Attending: Specialist | Admitting: Specialist

## 2016-09-25 DIAGNOSIS — R918 Other nonspecific abnormal finding of lung field: Secondary | ICD-10-CM | POA: Diagnosis present

## 2016-09-25 LAB — GLUCOSE, CAPILLARY: Glucose-Capillary: 84 mg/dL (ref 65–99)

## 2016-09-25 MED ORDER — FLUDEOXYGLUCOSE F - 18 (FDG) INJECTION
12.0000 | Freq: Once | INTRAVENOUS | Status: AC | PRN
  Administered 2016-09-25: 12.64 via INTRAVENOUS

## 2016-10-18 ENCOUNTER — Other Ambulatory Visit: Payer: Self-pay | Admitting: *Deleted

## 2016-10-18 ENCOUNTER — Ambulatory Visit
Admission: RE | Admit: 2016-10-18 | Discharge: 2016-10-18 | Disposition: A | Discharge status: Home / Self Care | Payer: Medicare Other | Source: Ambulatory Visit | Attending: Radiation Oncology | Admitting: Radiation Oncology

## 2016-10-18 ENCOUNTER — Encounter (INDEPENDENT_AMBULATORY_CARE_PROVIDER_SITE_OTHER): Payer: Self-pay

## 2016-10-18 ENCOUNTER — Encounter: Payer: Self-pay | Admitting: Radiation Oncology

## 2016-10-18 VITALS — BP 156/88 | HR 78 | Temp 96.7°F | Wt 118.5 lb

## 2016-10-18 DIAGNOSIS — Z79899 Other long term (current) drug therapy: Secondary | ICD-10-CM | POA: Diagnosis not present

## 2016-10-18 DIAGNOSIS — Z510 Encounter for antineoplastic radiation therapy: Secondary | ICD-10-CM | POA: Diagnosis not present

## 2016-10-18 DIAGNOSIS — J439 Emphysema, unspecified: Secondary | ICD-10-CM | POA: Insufficient documentation

## 2016-10-18 DIAGNOSIS — Z7982 Long term (current) use of aspirin: Secondary | ICD-10-CM | POA: Insufficient documentation

## 2016-10-18 DIAGNOSIS — I700 Atherosclerosis of aorta: Secondary | ICD-10-CM | POA: Insufficient documentation

## 2016-10-18 DIAGNOSIS — Z87891 Personal history of nicotine dependence: Secondary | ICD-10-CM | POA: Insufficient documentation

## 2016-10-18 DIAGNOSIS — R918 Other nonspecific abnormal finding of lung field: Secondary | ICD-10-CM

## 2016-10-18 DIAGNOSIS — J449 Chronic obstructive pulmonary disease, unspecified: Secondary | ICD-10-CM | POA: Insufficient documentation

## 2016-10-18 NOTE — Consult Note (Signed)
NEW PATIENT EVALUATION  Name: Brooke Trevino  MRN: [id number]  Date:   10/18/2016     DOB: 1935/10/03   This 81 y.o. female patient presents to the clinic for initial evaluation of multiple lung lesions hypermetabolic on PET CT scan consistent with non-small cell lung cancer in patient with significant compromise pulmonary function.  REFERRING PHYSICIAN: Sofie Hartigan, MD  CHIEF COMPLAINT:  Chief Complaint  Patient presents with  . Lung Mass    Initial Evaluation for Radiation Treatment    DIAGNOSIS: The encounter diagnosis was Lung mass.   PREVIOUS INVESTIGATIONS:  PET CT and CT scans reviewed No pathology report for review Clinical notes reviewed  HPI: patient is a 81 year old female followed in referred by pulmonology for the development of hemoptysis. She does have a slight productive cough. Recent PET CT scan was performed. PET CT scan demonstrated 2 concerning lesions in the middle right upper lobe and posterior right lower lobe demonstrating hypermetabolic activity consistent with primary bronchogenic carcinoma. She also has a left midlung field lesion measuring 5.3 cm cavitary in nature with low FDG accumulation which may indicate a resolving inflammatory process. Patient has significant COPD is oxygen dependent on constant nasal oxygen. She also has essential hypertension, anxiety a abdominal aortic aneurysm congestive heart failure as well as severe COPD. I was asked to evaluate the patient for possible SB RT treatment.  PLANNED TREATMENT REGIMEN: SB RT treatment  PAST MEDICAL HISTORY:  has a past medical history of Emphysema lung ([place]) and History of COPD.    PAST SURGICAL HISTORY:  Past Surgical History:  Procedure Laterality Date  . CHOLECYSTECTOMY    . ear drum     Ear drum replacement    FAMILY HISTORY: family history is not on file.  SOCIAL HISTORY:  reports that she quit smoking about 11 years ago. She does not have any smokeless tobacco history on  file. She reports that she does not drink alcohol.  ALLERGIES: Patient has no known allergies.  MEDICATIONS:  Current Outpatient Prescriptions  Medication Sig Dispense Refill  . meloxicam (MOBIC) 7.5 MG tablet TAKE 1 TABLET(7.5 MG) BY MOUTH EVERY DAY    . albuterol (PROVENTIL) (2.5 MG/3ML) 0.083% nebulizer solution Take 2.5 mg by nebulization every 4 (four) hours as needed for wheezing or shortness of breath.    Marland Kitchen aspirin 81 MG chewable tablet Chew 81 mg by mouth at bedtime.    . calcium carbonate (TUMS - DOSED IN MG ELEMENTAL CALCIUM) 500 MG chewable tablet Chew 1 tablet by mouth as needed for indigestion or heartburn.    . carvedilol (COREG) 6.25 MG tablet Take 1 tablet (6.25 mg total) by mouth 2 (two) times daily with a meal. 60 tablet 5  . citalopram (CELEXA) 10 MG tablet Take 10 mg by mouth daily.    . Fluticasone-Salmeterol (ADVAIR) 250-50 MCG/DOSE AEPB Inhale 1 puff into the lungs 2 (two) times daily.    . furosemide (LASIX) 40 MG tablet Take 1 tablet (40 mg total) by mouth 2 (two) times daily. 60 tablet 5  . ipratropium-albuterol (DUONEB) 0.5-2.5 (3) MG/3ML SOLN Take 3 mLs by nebulization every 4 (four) hours.    Marland Kitchen levofloxacin (LEVAQUIN) 750 MG tablet Take 1 tablet (750 mg total) by mouth every other day. 4 tablet 0  . lisinopril (PRINIVIL,ZESTRIL) 5 MG tablet Take 1 tablet (5 mg total) by mouth daily. 30 tablet 5  . montelukast (SINGULAIR) 10 MG tablet Take 10 mg by mouth at bedtime.    Marland Kitchen  Multiple Vitamins-Minerals (PRESERVISION/LUTEIN) CAPS Take 1 capsule by mouth 2 (two) times daily.    . potassium chloride SA (K-DUR,KLOR-CON) 20 MEQ tablet Take 1 tablet (20 mEq total) by mouth daily. 30 tablet 5  . pravastatin (PRAVACHOL) 20 MG tablet Take 20 mg by mouth at bedtime.    . predniSONE (STERAPRED UNI-PAK 21 TAB) 10 MG (21) TBPK tablet Take 1 tablet (10 mg total) by mouth daily. Please take 6 pills in the morning on the day one, then taper by 1 pill daily until finished, thank you 21  tablet 0  . temazepam (RESTORIL) 30 MG capsule Take 30 mg by mouth at bedtime as needed for sleep.    . vitamin B-12 (CYANOCOBALAMIN) 1000 MCG tablet Take by mouth.     No current facility-administered medications for this encounter.     ECOG PERFORMANCE STATUS:  2 - Symptomatic, <50% confined to bed  REVIEW OF SYSTEMS:  Patient denies any weight loss, fatigue, weakness, fever, chills or night sweats. Patient denies any loss of vision, blurred vision. Patient denies any ringing  of the ears or hearing loss. No irregular heartbeat. Patient denies heart murmur or history of fainting. Patient denies any chest pain or pain radiating to her upper extremities. Patient denies any shortness of breath, difficulty breathing at night, cough or hemoptysis. Patient denies any swelling in the lower legs. Patient denies any nausea vomiting, vomiting of blood, or coffee ground material in the vomitus. Patient denies any stomach pain. Patient states has had normal bowel movements no significant constipation or diarrhea. Patient denies any dysuria, hematuria or significant nocturia. Patient denies any problems walking, swelling in the joints or loss of balance. Patient denies any skin changes, loss of hair or loss of weight. Patient denies any excessive worrying or anxiety or significant depression. Patient denies any problems with insomnia. Patient denies excessive thirst, polyuria, polydipsia. Patient denies any swollen glands, patient denies easy bruising or easy bleeding. Patient denies any recent infections, allergies or URI. Patient "s visual fields have not changed significantly in recent time.    PHYSICAL EXAM: BP (!) 156/88   Pulse 78   Temp (!) 96.7 F (35.9 C)   Wt 118 lb 8 oz (53.8 kg)   BMI 21.67 kg/m  hin slightly cachectic female on nasal oxygen wheelchair-bound. Well-developed well-nourished patient in NAD. HEENT reveals PERLA, EOMI, discs not visualized.  Oral cavity is clear. No oral mucosal  lesions are identified. Neck is clear without evidence of cervical or supraclavicular adenopathy. Lungs are clear to A&P. Cardiac examination is essentially unremarkable with regular rate and rhythm without murmur rub or thrill. Abdomen is benign with no organomegaly or masses noted. Motor sensory and DTR levels are equal and symmetric in the upper and lower extremities. Cranial nerves II through XII are grossly intact. Proprioception is intact. No peripheral adenopathy or edema is identified. No motor or sensory levels are noted. Crude visual fields are within normal range.  LABORATORY DATA: no pathology reports reviewed    RADIOLOGY RESULTS:PET CT and CT scans reviewed   IMPRESSION: probable multiple primary non-small cell lung cancers in 81 year old female with significant comorbidities including oxygen dependent COPD  PLAN: at this time like to present at our weekly tumor conference. I believe based on her extremely poor PFTs oxygen dependent COPD and CHF she would not be a surgical candidate. Also would be a high risk for biopsy of these lesions. I would treat her  Larger right lung lesion with SB RT treatment  to 5000 cGy in 5 fractions using 3-D motion study. Once that is complete would give her small treatment break andsee her back for evaluation to treat the other right lung lesion again with SB RT 5000 cGy in 5 fractions. Risks and benefits of those procedures including possible fatigue skin reaction development of cough all were discussed in detail. I personally set up and arranged CT simulation with 40 motion study for next week. Patient and family seem to comprehend my treatment plan well.  I would like to take this opportunity to thank you for allowing me to participate in the care of your patient.Armstead Peaks., MD

## 2016-10-19 ENCOUNTER — Encounter: Payer: Self-pay | Admitting: *Deleted

## 2016-10-19 NOTE — Progress Notes (Signed)
  Oncology Nurse Navigator Documentation  Navigator Location: [facility]-Med Onc (10/18/16 1130) Referral date to RadOnc/MedOnc: 09/25/16 (10/18/16 1130) )Navigator Encounter Type: Initial RadOnc (10/18/16 1130)   Abnormal Finding Date: 09/14/16 (10/18/16 1130)                 Patient Visit Type: RadOnc (10/18/16 1130) Treatment Phase: Pre-Tx/Tx Discussion (10/18/16 1130) Barriers/Navigation Needs: No barriers at this time;No Questions;No Needs (10/18/16 1130)   Interventions: None required (10/18/16 1130)            Acuity: Level 1 (10/18/16 1130) Acuity Level 1: Initial guidance, education and coordination as needed (10/18/16 1130)    met with patient during initial radiation consultation with Dr. Baruch Gouty. All questions answered at time of visit. Contact info given to patient. Instructed to call with any further needs or questions. Pt and pt's granddaughter verbalized understanding.   Time Spent with Patient: 60 (10/18/16 1130)

## 2016-10-19 NOTE — Consult Note (Signed)
NEW PATIENT EVALUATION  Name: Brooke Trevino  MRN: [id number]  Date:   10/18/2016     DOB: 01/28/1936   This 81 y.o. female patient presents to the clinic for initial evaluation of multiple lung lesions consistent with multifocal non-small cell lung cancer in patient with significant medical comorbidities.  REFERRING PHYSICIAN: Sofie Hartigan, MD  CHIEF COMPLAINT:  Chief Complaint  Patient presents with  . Lung Mass    Initial Evaluation for Radiation Treatment    DIAGNOSIS: The encounter diagnosis was Lung mass.   PREVIOUS INVESTIGATIONS:  PET CT and CT scans reviewed Clinical notes reviewed Primary function and cardiac studies reviewed  HPI: patient is a 81 year old female with significant multiple medical comorbidities including COPD with an FEV1 of 0.68 L with an ejection fracture of 25%. She's had increasing cough and hemoptysisand on workup CT scan demonstrated multiple lung lesions. PET CT scan was performed with 2 lesions in the middle right upper lobe and posterior right lower lobe showing hypermetabolic activity consistent with malignancy. There is also a 5.3 sciatica centimeter cavitary lesion in the Upper left lung along the major fissure with only low level hypermetabolic activity. Patient is thought to be a poor risk for CT-guided biopsy and is now referred to radiation oncology for possible treatment. She is on continuous nasal oxygen. She is had less hemoptysis lately.patient also does have an ascending 4.8 cm thoracic aortic aneurysm being followed.  PLANNED TREATMENT REGIMEN: SB RT treatment  PAST MEDICAL HISTORY:  has a past medical history of Emphysema lung ([place]) and History of COPD.    PAST SURGICAL HISTORY:  Past Surgical History:  Procedure Laterality Date  . CHOLECYSTECTOMY    . ear drum     Ear drum replacement    FAMILY HISTORY: family history is not on file.  SOCIAL HISTORY:  reports that she quit smoking about 11 years ago. She does not  have any smokeless tobacco history on file. She reports that she does not drink alcohol.  ALLERGIES: Patient has no known allergies.  MEDICATIONS:  Current Outpatient Prescriptions  Medication Sig Dispense Refill  . meloxicam (MOBIC) 7.5 MG tablet TAKE 1 TABLET(7.5 MG) BY MOUTH EVERY DAY    . albuterol (PROVENTIL) (2.5 MG/3ML) 0.083% nebulizer solution Take 2.5 mg by nebulization every 4 (four) hours as needed for wheezing or shortness of breath.    Marland Kitchen aspirin 81 MG chewable tablet Chew 81 mg by mouth at bedtime.    . calcium carbonate (TUMS - DOSED IN MG ELEMENTAL CALCIUM) 500 MG chewable tablet Chew 1 tablet by mouth as needed for indigestion or heartburn.    . carvedilol (COREG) 6.25 MG tablet Take 1 tablet (6.25 mg total) by mouth 2 (two) times daily with a meal. 60 tablet 5  . citalopram (CELEXA) 10 MG tablet Take 10 mg by mouth daily.    . Fluticasone-Salmeterol (ADVAIR) 250-50 MCG/DOSE AEPB Inhale 1 puff into the lungs 2 (two) times daily.    . furosemide (LASIX) 40 MG tablet Take 1 tablet (40 mg total) by mouth 2 (two) times daily. 60 tablet 5  . ipratropium-albuterol (DUONEB) 0.5-2.5 (3) MG/3ML SOLN Take 3 mLs by nebulization every 4 (four) hours.    Marland Kitchen levofloxacin (LEVAQUIN) 750 MG tablet Take 1 tablet (750 mg total) by mouth every other day. 4 tablet 0  . lisinopril (PRINIVIL,ZESTRIL) 5 MG tablet Take 1 tablet (5 mg total) by mouth daily. 30 tablet 5  . montelukast (SINGULAIR) 10 MG tablet Take 10  mg by mouth at bedtime.    . Multiple Vitamins-Minerals (PRESERVISION/LUTEIN) CAPS Take 1 capsule by mouth 2 (two) times daily.    . potassium chloride SA (K-DUR,KLOR-CON) 20 MEQ tablet Take 1 tablet (20 mEq total) by mouth daily. 30 tablet 5  . pravastatin (PRAVACHOL) 20 MG tablet Take 20 mg by mouth at bedtime.    . predniSONE (STERAPRED UNI-PAK 21 TAB) 10 MG (21) TBPK tablet Take 1 tablet (10 mg total) by mouth daily. Please take 6 pills in the morning on the day one, then taper by 1 pill  daily until finished, thank you 21 tablet 0  . temazepam (RESTORIL) 30 MG capsule Take 30 mg by mouth at bedtime as needed for sleep.    . vitamin B-12 (CYANOCOBALAMIN) 1000 MCG tablet Take by mouth.     No current facility-administered medications for this encounter.     ECOG PERFORMANCE STATUS:  2 - Symptomatic, <50% confined to bed  REVIEW OF SYSTEMS: except for the trace hemoptysis cough poor overall general condition Patient denies any weight loss, fatigue, weakness, fever, chills or night sweats. Patient denies any loss of vision, blurred vision. Patient denies any ringing  of the ears or hearing loss. No irregular heartbeat. Patient denies heart murmur or history of fainting. Patient denies any chest pain or pain radiating to her upper extremities. Patient denies any shortness of breath, difficulty breathing at night, cough or hemoptysis. Patient denies any swelling in the lower legs. Patient denies any nausea vomiting, vomiting of blood, or coffee ground material in the vomitus. Patient denies any stomach pain. Patient states has had normal bowel movements no significant constipation or diarrhea. Patient denies any dysuria, hematuria or significant nocturia. Patient denies any problems walking, swelling in the joints or loss of balance. Patient denies any skin changes, loss of hair or loss of weight. Patient denies any excessive worrying or anxiety or significant depression. Patient denies any problems with insomnia. Patient denies excessive thirst, polyuria, polydipsia. Patient denies any swollen glands, patient denies easy bruising or easy bleeding. Patient denies any recent infections, allergies or URI. Patient "s visual fields have not changed significantly in recent time.    PHYSICAL EXAM: BP (!) 156/88   Pulse 78   Temp (!) 96.7 F (35.9 C)   Wt 118 lb 8 oz (53.8 kg)   BMI 21.67 kg/m  Frail-appearing female on nasal oxygen wheelchair-bound. Well-developed well-nourished patient in  NAD. HEENT reveals PERLA, EOMI, discs not visualized.  Oral cavity is clear. No oral mucosal lesions are identified. Neck is clear without evidence of cervical or supraclavicular adenopathy. Lungs are clear to A&P. Cardiac examination is essentially unremarkable with regular rate and rhythm without murmur rub or thrill. Abdomen is benign with no organomegaly or masses noted. Motor sensory and DTR levels are equal and symmetric in the upper and lower extremities. Cranial nerves II through XII are grossly intact. Proprioception is intact. No peripheral adenopathy or edema is identified. No motor or sensory levels are noted. Crude visual fields are within normal range.  LABORATORY DATA: no pathology for review    RADIOLOGY RESULTS:CT scans and PET/CT scan reviewed and compatible with the above-stated findings   IMPRESSION: probable multifocal non-small cell lung cancer in 81 year old female with significant medical comorbidities  PLAN: this time like to present her case at our weekly tumor conference. I believe the smaller to hypermetabolic lesions can be successfully treated with separate courses of SB RT treatment. Would plan on delivering 5000 cGy in  5 fractions to both lesions. I not that much concerned about the dominant 5 cm lesion in her left midlung field although recommendations from tumor board will be forthcoming. I have personally set up and ordered CT simulation with feet 4D study for next week. Risks and benefits of treatment including possible increased cough fatigue skin reaction all were discussed in detail with the patient and her daughter. They both seem to compress my treatment plan well.  I would like to take this opportunity to thank you for allowing me to participate in the care of your patient.Armstead Peaks., MD

## 2016-10-25 ENCOUNTER — Ambulatory Visit
Admission: RE | Admit: 2016-10-25 | Discharge: 2016-10-25 | Disposition: A | Discharge status: Home / Self Care | Payer: Medicare Other | Source: Ambulatory Visit | Attending: Radiation Oncology | Admitting: Radiation Oncology

## 2016-10-25 DIAGNOSIS — Z510 Encounter for antineoplastic radiation therapy: Secondary | ICD-10-CM | POA: Diagnosis not present

## 2016-11-02 ENCOUNTER — Other Ambulatory Visit (INDEPENDENT_AMBULATORY_CARE_PROVIDER_SITE_OTHER): Payer: Self-pay | Admitting: Vascular Surgery

## 2016-11-02 DIAGNOSIS — I714 Abdominal aortic aneurysm, without rupture, unspecified: Secondary | ICD-10-CM

## 2016-11-02 DIAGNOSIS — Z510 Encounter for antineoplastic radiation therapy: Secondary | ICD-10-CM | POA: Diagnosis not present

## 2016-11-02 DIAGNOSIS — I700 Atherosclerosis of aorta: Secondary | ICD-10-CM

## 2016-11-03 ENCOUNTER — Ambulatory Visit (INDEPENDENT_AMBULATORY_CARE_PROVIDER_SITE_OTHER): Payer: Medicare Other

## 2016-11-03 ENCOUNTER — Encounter (INDEPENDENT_AMBULATORY_CARE_PROVIDER_SITE_OTHER): Payer: Self-pay | Admitting: Vascular Surgery

## 2016-11-03 ENCOUNTER — Ambulatory Visit (INDEPENDENT_AMBULATORY_CARE_PROVIDER_SITE_OTHER): Payer: Medicare Other | Admitting: Vascular Surgery

## 2016-11-03 VITALS — BP 125/71 | HR 68 | Resp 17 | Ht 64.0 in | Wt 116.0 lb

## 2016-11-03 DIAGNOSIS — I10 Essential (primary) hypertension: Secondary | ICD-10-CM | POA: Diagnosis not present

## 2016-11-03 DIAGNOSIS — I714 Abdominal aortic aneurysm, without rupture, unspecified: Secondary | ICD-10-CM | POA: Insufficient documentation

## 2016-11-03 DIAGNOSIS — I700 Atherosclerosis of aorta: Secondary | ICD-10-CM

## 2016-11-03 DIAGNOSIS — J439 Emphysema, unspecified: Secondary | ICD-10-CM | POA: Diagnosis not present

## 2016-11-03 NOTE — Progress Notes (Signed)
MRN : [id number]  Brooke Trevino is a 81 y.o. ([birth year]) female who presents with chief complaint of  Chief Complaint  Patient presents with  . AAA    1 year follow up  .  History of Present Illness: Patient returns today in follow up of Her aneurysm. She remains on continuous oxygen therapy but has done well with that. She denies any aneurysm related symptoms. Specifically, the patient denies new back or abdominal pain, or signs of peripheral embolization. Her aortic duplex today shows no change in her abdominal aortic aneurysm measuring 3.5 cm in maximal diameter.  Current Outpatient Prescriptions  Medication Sig Dispense Refill  . albuterol (PROVENTIL) (2.5 MG/3ML) 0.083% nebulizer solution Take 2.5 mg by nebulization every 4 (four) hours as needed for wheezing or shortness of breath.    Marland Kitchen aspirin 81 MG chewable tablet Chew 81 mg by mouth at bedtime.    . calcium carbonate (TUMS - DOSED IN MG ELEMENTAL CALCIUM) 500 MG chewable tablet Chew 1 tablet by mouth as needed for indigestion or heartburn.    . carvedilol (COREG) 6.25 MG tablet Take 1 tablet (6.25 mg total) by mouth 2 (two) times daily with a meal. 60 tablet 5  . citalopram (CELEXA) 10 MG tablet Take 10 mg by mouth daily.    . Fluticasone-Salmeterol (ADVAIR) 250-50 MCG/DOSE AEPB Inhale 1 puff into the lungs 2 (two) times daily.    . furosemide (LASIX) 40 MG tablet Take 1 tablet (40 mg total) by mouth 2 (two) times daily. 60 tablet 5  . ipratropium-albuterol (DUONEB) 0.5-2.5 (3) MG/3ML SOLN Take 3 mLs by nebulization every 4 (four) hours.    Marland Kitchen lisinopril (PRINIVIL,ZESTRIL) 10 MG tablet     . meloxicam (MOBIC) 7.5 MG tablet TAKE 1 TABLET(7.5 MG) BY MOUTH EVERY DAY    . montelukast (SINGULAIR) 10 MG tablet Take 10 mg by mouth at bedtime.    . Multiple Vitamins-Minerals (PRESERVISION/LUTEIN) CAPS Take 1 capsule by mouth 2 (two) times daily.    . potassium chloride SA (K-DUR,KLOR-CON) 20 MEQ tablet Take 1 tablet (20 mEq total)  by mouth daily. 30 tablet 5  . pravastatin (PRAVACHOL) 20 MG tablet Take 20 mg by mouth at bedtime.    . temazepam (RESTORIL) 30 MG capsule Take 30 mg by mouth at bedtime as needed for sleep.    . vitamin B-12 (CYANOCOBALAMIN) 1000 MCG tablet Take by mouth.     No current facility-administered medications for this visit.     Past Medical History:  Diagnosis Date  . Emphysema lung ([place])   . History of COPD     Past Surgical History:  Procedure Laterality Date  . CHOLECYSTECTOMY    . ear drum     Ear drum replacement    Social History Social History  Substance Use Topics  . Smoking status: Former Smoker    Quit date: 08/08/2005  . Smokeless tobacco: Never Used  . Alcohol use No    Family History Family History  Problem Relation Age of Onset  . Breast cancer Neg Hx   No bleeding or clotting disorders  No Known Allergies   REVIEW OF SYSTEMS (Negative unless checked)  Constitutional: [] Weight loss  [] Fever  [] Chills Cardiac: [] Chest pain   [] Chest pressure   [] Palpitations   [] Shortness of breath when laying flat   [] Shortness of breath at rest   [x] Shortness of breath with exertion. Vascular:  [] Pain in legs with walking   [] Pain in legs at rest   []   Pain in legs when laying flat   [] Claudication   [] Pain in feet when walking  [] Pain in feet at rest  [] Pain in feet when laying flat   [] History of DVT   [] Phlebitis   [] Swelling in legs   [] Varicose veins   [] Non-healing ulcers Pulmonary:   [x] Uses home oxygen   [] Productive cough   [] Hemoptysis   [] Wheeze  [x] COPD   [] Asthma Neurologic:  [] Dizziness  [] Blackouts   [] Seizures   [] History of stroke   [] History of TIA  [] Aphasia   [] Temporary blindness   [] Dysphagia   [] Weakness or numbness in arms   [] Weakness or numbness in legs Musculoskeletal:  [x] Arthritis   [] Joint swelling   [] Joint pain   [] Low back pain Hematologic:  [] Easy bruising  [] Easy bleeding   [] Hypercoagulable state   [] Anemic   Gastrointestinal:  [] Blood in  stool   [] Vomiting blood  [] Gastroesophageal reflux/heartburn   [] Abdominal pain Genitourinary:  [] Chronic kidney disease   [] Difficult urination  [] Frequent urination  [] Burning with urination   [] Hematuria Skin:  [] Rashes   [] Ulcers   [] Wounds Psychological:  [] History of anxiety   []  History of major depression.  Physical Examination  BP 125/71 (BP Location: Right Arm)   Pulse 68   Resp 17   Ht 5\' 4"  (1.626 m)   Wt 116 lb (52.6 kg)   BMI 19.91 kg/m  Gen:  WD/WN, NAD. Appears younger than stated age Head: [place]/AT, No temporalis wasting. Ear/Nose/Throat: Hearing grossly intact, nares w/o erythema or drainage, trachea midline Eyes: Conjunctiva clear. Sclera non-icteric Neck: Supple.  No JVD.  Pulmonary:  Good air movement, no use of accessory muscles on supplemental oxygen.  Cardiac: RRR, normal S1, S2 Vascular:  Vessel Right Left  Radial Palpable Palpable                                   Musculoskeletal: M/S 5/5 throughout.  No deformity or atrophy. Trace lower extremity edema. Neurologic: Sensation grossly intact in extremities.  Symmetrical.  Speech is fluent.  Psychiatric: Judgment intact, Mood & affect appropriate for pt's clinical situation. Dermatologic: No rashes or ulcers noted.  No cellulitis or open wounds.       Labs Recent Results (from the past 2160 hour(s))  Glucose, capillary     Status: None   Collection Time: 09/25/16 11:12 AM  Result Value Ref Range   Glucose-Capillary 84 65 - 99 mg/dL    Radiology No results found.   Assessment/Plan  Emphysema lung (HCC) Uses continuous oxygen and is stable.  Essential hypertension, benign blood pressure control important in reducing the progression of atherosclerotic disease and aneurysmal disease. On appropriate oral medications.   AAA (abdominal aortic aneurysm) without rupture (HCC) Her aortic duplex today shows no change in her abdominal aortic aneurysm measuring 3.5 cm in maximal diameter. No  surgery or intervention at this time. The patient has an asymptomatic abdominal aortic aneurysm that is less than 4 cm in maximal diameter.  I have discussed the natural history of abdominal aortic aneurysm and the small risk of rupture for aneurysm less than 5 cm in size.  However, as these small aneurysms tend to enlarge over time, continued surveillance with ultrasound or CT scan is mandatory.  I have also discussed optimizing medical management with hypertension and lipid control and the importance of abstinence from tobacco.  The patient is also encouraged to exercise a minimum of 30  minutes 4 times a week.  Should the patient develop new onset abdominal or back pain or signs of peripheral embolization they are instructed to seek medical attention immediately and to alert the physician providing care that they have an aneurysm.  The patient voices their understanding. The patient will return in 12 months with an aortic duplex.     Leotis Pain, MD  11/03/2016 9:40 AM    This note was created with Dragon medical transcription system.  Any errors from dictation are purely unintentional

## 2016-11-03 NOTE — Assessment & Plan Note (Signed)
blood pressure control important in reducing the progression of atherosclerotic disease and aneurysmal disease. On appropriate oral medications.  

## 2016-11-03 NOTE — Patient Instructions (Signed)
Abdominal Aortic Aneurysm Blood pumps away from the heart through tubes (blood vessels) called arteries. Aneurysms are weak or damaged places in the wall of an artery. It bulges out like a balloon. An abdominal aortic aneurysm happens in the main artery of the body (aorta). It can burst or tear, causing bleeding inside the body. This is an emergency. It needs treatment right away. What are the causes? The exact cause is unknown. Things that could cause this problem include:  Fat and other substances building up in the lining of a tube.  Swelling of the walls of a blood vessel.  Certain tissue diseases.  Belly (abdominal) trauma.  An infection in the main artery of the body.  What increases the risk? There are things that make it more likely for you to have an aneurysm. These include:  Being over the age of 79 years old.  Having high blood pressure (hypertension).  Being a female.  Being white.  Being very overweight (obese).  Having a family history of aneurysm.  Using tobacco products.  What are the signs or symptoms? Symptoms depend on the size of the aneurysm and how fast it grows. There may not be symptoms. If symptoms occur, they can include:  Pain (belly, side, lower back, or groin).  Feeling full after eating a small amount of food.  Feeling sick to your stomach (nauseous), throwing up (vomiting), or both.  Feeling a lump in your belly that feels like it is beating (pulsating).  Feeling like you will pass out (faint).  How is this treated?  Medicine to control blood pressure and pain.  Imaging tests to see if the aneurysm gets bigger.  Surgery. How is this prevented? To lessen your chance of getting this condition:  Stop smoking. Stop chewing tobacco.  Limit or avoid alcohol.  Keep your blood pressure, blood sugar, and cholesterol within normal limits.  Eat less salt.  Eat foods low in saturated fats and cholesterol. These are found in animal and  whole dairy products.  Eat more fiber. Fiber is found in whole grains, vegetables, and fruits.  Keep a healthy weight.  Stay active and exercise often.  This information is not intended to replace advice given to you by your health care provider. Make sure you discuss any questions you have with your health care provider. Document Released: 06/24/2012 Document Revised: 08/05/2015 Document Reviewed: 03/29/2012 Elsevier Interactive Patient Education  2017 [employer]  

## 2016-11-03 NOTE — Assessment & Plan Note (Signed)
Her aortic duplex today shows no change in her abdominal aortic aneurysm measuring 3.5 cm in maximal diameter. No surgery or intervention at this time. The patient has an asymptomatic abdominal aortic aneurysm that is less than 4 cm in maximal diameter.  I have discussed the natural history of abdominal aortic aneurysm and the small risk of rupture for aneurysm less than 5 cm in size.  However, as these small aneurysms tend to enlarge over time, continued surveillance with ultrasound or CT scan is mandatory.  I have also discussed optimizing medical management with hypertension and lipid control and the importance of abstinence from tobacco.  The patient is also encouraged to exercise a minimum of 30 minutes 4 times a week.  Should the patient develop new onset abdominal or back pain or signs of peripheral embolization they are instructed to seek medical attention immediately and to alert the physician providing care that they have an aneurysm.  The patient voices their understanding. The patient will return in 12 months with an aortic duplex.

## 2016-11-03 NOTE — Assessment & Plan Note (Signed)
Uses continuous oxygen and is stable.

## 2016-11-07 ENCOUNTER — Ambulatory Visit
Admission: RE | Admit: 2016-11-07 | Discharge: 2016-11-07 | Disposition: A | Discharge status: Home / Self Care | Payer: Medicare Other | Source: Ambulatory Visit | Attending: Radiation Oncology | Admitting: Radiation Oncology

## 2016-11-07 DIAGNOSIS — Z510 Encounter for antineoplastic radiation therapy: Secondary | ICD-10-CM | POA: Diagnosis not present

## 2016-11-09 ENCOUNTER — Ambulatory Visit
Admission: RE | Admit: 2016-11-09 | Discharge: 2016-11-09 | Disposition: A | Discharge status: Home / Self Care | Payer: Medicare Other | Source: Ambulatory Visit | Attending: Radiation Oncology | Admitting: Radiation Oncology

## 2016-11-09 DIAGNOSIS — Z510 Encounter for antineoplastic radiation therapy: Secondary | ICD-10-CM | POA: Diagnosis not present

## 2016-11-14 ENCOUNTER — Ambulatory Visit
Admission: RE | Admit: 2016-11-14 | Discharge: 2016-11-14 | Disposition: A | Discharge status: Home / Self Care | Payer: Medicare Other | Source: Ambulatory Visit | Attending: Radiation Oncology | Admitting: Radiation Oncology

## 2016-11-14 DIAGNOSIS — Z510 Encounter for antineoplastic radiation therapy: Secondary | ICD-10-CM | POA: Diagnosis not present

## 2016-11-16 ENCOUNTER — Ambulatory Visit
Admission: RE | Admit: 2016-11-16 | Discharge: 2016-11-16 | Disposition: A | Discharge status: Home / Self Care | Payer: Medicare Other | Source: Ambulatory Visit | Attending: Radiation Oncology | Admitting: Radiation Oncology

## 2016-11-16 DIAGNOSIS — Z510 Encounter for antineoplastic radiation therapy: Secondary | ICD-10-CM | POA: Diagnosis not present

## 2016-11-21 ENCOUNTER — Ambulatory Visit
Admission: RE | Admit: 2016-11-21 | Discharge: 2016-11-21 | Disposition: A | Discharge status: Home / Self Care | Payer: Medicare Other | Source: Ambulatory Visit | Attending: Radiation Oncology | Admitting: Radiation Oncology

## 2016-11-21 DIAGNOSIS — Z510 Encounter for antineoplastic radiation therapy: Secondary | ICD-10-CM | POA: Diagnosis not present

## 2016-12-19 ENCOUNTER — Emergency Department: Payer: Medicare Other

## 2016-12-19 ENCOUNTER — Inpatient Hospital Stay
Admission: EM | Admit: 2016-12-19 | Discharge: 2016-12-31 | DRG: 291 | Disposition: A | Discharge status: Hospice | Payer: Medicare Other | Attending: Internal Medicine | Admitting: Internal Medicine

## 2016-12-19 DIAGNOSIS — Z79899 Other long term (current) drug therapy: Secondary | ICD-10-CM | POA: Diagnosis not present

## 2016-12-19 DIAGNOSIS — Z515 Encounter for palliative care: Secondary | ICD-10-CM | POA: Diagnosis present

## 2016-12-19 DIAGNOSIS — Z7982 Long term (current) use of aspirin: Secondary | ICD-10-CM | POA: Diagnosis not present

## 2016-12-19 DIAGNOSIS — I420 Dilated cardiomyopathy: Secondary | ICD-10-CM | POA: Diagnosis not present

## 2016-12-19 DIAGNOSIS — E440 Moderate protein-calorie malnutrition: Secondary | ICD-10-CM | POA: Diagnosis present

## 2016-12-19 DIAGNOSIS — Z9049 Acquired absence of other specified parts of digestive tract: Secondary | ICD-10-CM

## 2016-12-19 DIAGNOSIS — I5020 Unspecified systolic (congestive) heart failure: Secondary | ICD-10-CM

## 2016-12-19 DIAGNOSIS — I110 Hypertensive heart disease with heart failure: Secondary | ICD-10-CM | POA: Diagnosis present

## 2016-12-19 DIAGNOSIS — Z7951 Long term (current) use of inhaled steroids: Secondary | ICD-10-CM | POA: Diagnosis not present

## 2016-12-19 DIAGNOSIS — Z923 Personal history of irradiation: Secondary | ICD-10-CM

## 2016-12-19 DIAGNOSIS — J9622 Acute and chronic respiratory failure with hypercapnia: Secondary | ICD-10-CM | POA: Diagnosis not present

## 2016-12-19 DIAGNOSIS — J9600 Acute respiratory failure, unspecified whether with hypoxia or hypercapnia: Secondary | ICD-10-CM

## 2016-12-19 DIAGNOSIS — C3491 Malignant neoplasm of unspecified part of right bronchus or lung: Secondary | ICD-10-CM | POA: Diagnosis present

## 2016-12-19 DIAGNOSIS — J9620 Acute and chronic respiratory failure, unspecified whether with hypoxia or hypercapnia: Secondary | ICD-10-CM

## 2016-12-19 DIAGNOSIS — K5900 Constipation, unspecified: Secondary | ICD-10-CM | POA: Diagnosis not present

## 2016-12-19 DIAGNOSIS — J9690 Respiratory failure, unspecified, unspecified whether with hypoxia or hypercapnia: Secondary | ICD-10-CM

## 2016-12-19 DIAGNOSIS — Z681 Body mass index (BMI) 19 or less, adult: Secondary | ICD-10-CM | POA: Diagnosis not present

## 2016-12-19 DIAGNOSIS — I5023 Acute on chronic systolic (congestive) heart failure: Secondary | ICD-10-CM | POA: Diagnosis present

## 2016-12-19 DIAGNOSIS — J449 Chronic obstructive pulmonary disease, unspecified: Secondary | ICD-10-CM | POA: Diagnosis not present

## 2016-12-19 DIAGNOSIS — J441 Chronic obstructive pulmonary disease with (acute) exacerbation: Secondary | ICD-10-CM | POA: Diagnosis present

## 2016-12-19 DIAGNOSIS — Z9981 Dependence on supplemental oxygen: Secondary | ICD-10-CM | POA: Diagnosis not present

## 2016-12-19 DIAGNOSIS — Z66 Do not resuscitate: Secondary | ICD-10-CM | POA: Diagnosis present

## 2016-12-19 DIAGNOSIS — J9621 Acute and chronic respiratory failure with hypoxia: Secondary | ICD-10-CM | POA: Diagnosis present

## 2016-12-19 DIAGNOSIS — R0602 Shortness of breath: Secondary | ICD-10-CM | POA: Diagnosis present

## 2016-12-19 DIAGNOSIS — I5022 Chronic systolic (congestive) heart failure: Secondary | ICD-10-CM

## 2016-12-19 DIAGNOSIS — I429 Cardiomyopathy, unspecified: Secondary | ICD-10-CM | POA: Diagnosis present

## 2016-12-19 DIAGNOSIS — C3431 Malignant neoplasm of lower lobe, right bronchus or lung: Secondary | ICD-10-CM | POA: Diagnosis not present

## 2016-12-19 DIAGNOSIS — I5084 End stage heart failure: Secondary | ICD-10-CM | POA: Diagnosis present

## 2016-12-19 DIAGNOSIS — Z87891 Personal history of nicotine dependence: Secondary | ICD-10-CM | POA: Diagnosis not present

## 2016-12-19 DIAGNOSIS — Z23 Encounter for immunization: Secondary | ICD-10-CM | POA: Diagnosis not present

## 2016-12-19 DIAGNOSIS — J939 Pneumothorax, unspecified: Secondary | ICD-10-CM

## 2016-12-19 DIAGNOSIS — Z8679 Personal history of other diseases of the circulatory system: Secondary | ICD-10-CM

## 2016-12-19 DIAGNOSIS — J9383 Other pneumothorax: Secondary | ICD-10-CM | POA: Diagnosis not present

## 2016-12-19 LAB — CBC
HCT: 39.7 % (ref 35.0–47.0)
Hemoglobin: 13.3 g/dL (ref 12.0–16.0)
MCH: 31.1 pg (ref 26.0–34.0)
MCHC: 33.6 g/dL (ref 32.0–36.0)
MCV: 92.8 fL (ref 80.0–100.0)
PLATELETS: 264 10*3/uL (ref 150–440)
RBC: 4.28 MIL/uL (ref 3.80–5.20)
RDW: 14.4 % (ref 11.5–14.5)
WBC: 9.2 10*3/uL (ref 3.6–11.0)

## 2016-12-19 LAB — TROPONIN I
Troponin I: <0.03 ng/mL (ref <0.03)
Troponin I: <0.03 ng/mL (ref <0.03)

## 2016-12-19 LAB — BASIC METABOLIC PANEL
ANION GAP: 11 (ref 5–15)
BUN: 15 mg/dL (ref 6–20)
CALCIUM: 9.4 mg/dL (ref 8.9–10.3)
CO2: 36 mmol/L — AB (ref 22–32)
Chloride: 85 mmol/L — ABNORMAL LOW (ref 101–111)
Creatinine, Ser: 0.56 mg/dL (ref 0.44–1.00)
GFR calc non Af Amer: >60 mL/min (ref >60)
Glucose, Bld: 137 mg/dL — ABNORMAL HIGH (ref 65–99)
Potassium: 4.4 mmol/L (ref 3.5–5.1)
Sodium: 132 mmol/L — ABNORMAL LOW (ref 135–145)

## 2016-12-19 LAB — BRAIN NATRIURETIC PEPTIDE: B NATRIURETIC PEPTIDE 5: 930 pg/mL — AB (ref 0.0–100.0)

## 2016-12-19 LAB — MRSA PCR SCREENING: MRSA BY PCR: NEGATIVE

## 2016-12-19 MED ORDER — CITALOPRAM HYDROBROMIDE 20 MG PO TABS
10.0000 mg | ORAL_TABLET | Freq: Every day | ORAL | Status: DC
  Administered 2016-12-20 – 2016-12-31 (×12): 10 mg via ORAL
  Filled 2016-12-19 (×12): qty 1

## 2016-12-19 MED ORDER — TEMAZEPAM 15 MG PO CAPS
30.0000 mg | ORAL_CAPSULE | Freq: Every evening | ORAL | Status: DC | PRN
  Administered 2016-12-19 – 2016-12-30 (×12): 30 mg via ORAL
  Filled 2016-12-19 (×5): qty 2
  Filled 2016-12-19: qty 4
  Filled 2016-12-19 (×6): qty 2

## 2016-12-19 MED ORDER — FUROSEMIDE 10 MG/ML IJ SOLN
40.0000 mg | Freq: Once | INTRAMUSCULAR | Status: AC
  Administered 2016-12-19: 40 mg via INTRAVENOUS
  Filled 2016-12-19: qty 4

## 2016-12-19 MED ORDER — CHLORHEXIDINE GLUCONATE 0.12 % MT SOLN
15.0000 mL | Freq: Two times a day (BID) | OROMUCOSAL | Status: DC
  Administered 2016-12-19 – 2016-12-31 (×21): 15 mL via OROMUCOSAL
  Filled 2016-12-19 (×17): qty 15

## 2016-12-19 MED ORDER — FUROSEMIDE 10 MG/ML IJ SOLN
20.0000 mg | Freq: Three times a day (TID) | INTRAMUSCULAR | Status: DC
  Administered 2016-12-19 – 2016-12-21 (×5): 20 mg via INTRAVENOUS
  Filled 2016-12-19 (×5): qty 2

## 2016-12-19 MED ORDER — ONDANSETRON HCL 4 MG/2ML IJ SOLN: 4.0000 mg | Freq: Four times a day (QID) | INTRAMUSCULAR | Status: DC | PRN

## 2016-12-19 MED ORDER — INFLUENZA VAC SPLIT HIGH-DOSE 0.5 ML IM SUSY
0.5000 mL | PREFILLED_SYRINGE | INTRAMUSCULAR | Status: DC
  Filled 2016-12-19: qty 0.5

## 2016-12-19 MED ORDER — METHYLPREDNISOLONE SODIUM SUCC 40 MG IJ SOLR
40.0000 mg | Freq: Three times a day (TID) | INTRAMUSCULAR | Status: DC
  Administered 2016-12-19 – 2016-12-21 (×5): 40 mg via INTRAVENOUS
  Filled 2016-12-19 (×5): qty 1

## 2016-12-19 MED ORDER — ENOXAPARIN SODIUM 40 MG/0.4ML ~~LOC~~ SOLN
40.0000 mg | SUBCUTANEOUS | Status: DC
  Administered 2016-12-19 – 2016-12-30 (×10): 40 mg via SUBCUTANEOUS
  Filled 2016-12-19 (×12): qty 0.4

## 2016-12-19 MED ORDER — ORAL CARE MOUTH RINSE
15.0000 mL | Freq: Two times a day (BID) | OROMUCOSAL | Status: DC
Start: 2016-12-20 — End: 2016-12-31
  Administered 2016-12-21 – 2016-12-30 (×7): 15 mL via OROMUCOSAL

## 2016-12-19 MED ORDER — PNEUMOCOCCAL VAC POLYVALENT 25 MCG/0.5ML IJ INJ: 0.5000 mL | INJECTION | INTRAMUSCULAR | Status: DC

## 2016-12-19 MED ORDER — MONTELUKAST SODIUM 10 MG PO TABS
10.0000 mg | ORAL_TABLET | Freq: Every day | ORAL | Status: DC
  Administered 2016-12-19 – 2016-12-30 (×12): 10 mg via ORAL
  Filled 2016-12-19 (×12): qty 1

## 2016-12-19 MED ORDER — LISINOPRIL 5 MG PO TABS
10.0000 mg | ORAL_TABLET | Freq: Every day | ORAL | Status: DC
  Administered 2016-12-19 – 2016-12-26 (×8): 10 mg via ORAL
  Filled 2016-12-19 (×8): qty 2

## 2016-12-19 MED ORDER — ONDANSETRON HCL 4 MG PO TABS: 4.0000 mg | ORAL_TABLET | Freq: Four times a day (QID) | ORAL | Status: DC | PRN

## 2016-12-19 MED ORDER — CALCIUM CARBONATE ANTACID 500 MG PO CHEW
1.0000 | CHEWABLE_TABLET | Freq: Four times a day (QID) | ORAL | Status: DC | PRN
Start: 2016-12-19 — End: 2016-12-31

## 2016-12-19 MED ORDER — MORPHINE SULFATE (PF) 2 MG/ML IV SOLN
2.0000 mg | INTRAVENOUS | Status: DC | PRN
  Administered 2016-12-19 – 2016-12-24 (×5): 2 mg via INTRAVENOUS
  Filled 2016-12-19 (×5): qty 1

## 2016-12-19 MED ORDER — PRAVASTATIN SODIUM 20 MG PO TABS
20.0000 mg | ORAL_TABLET | Freq: Every day | ORAL | Status: DC
  Administered 2016-12-19 – 2016-12-26 (×8): 20 mg via ORAL
  Filled 2016-12-19 (×8): qty 1

## 2016-12-19 MED ORDER — POTASSIUM CHLORIDE CRYS ER 20 MEQ PO TBCR
20.0000 meq | EXTENDED_RELEASE_TABLET | Freq: Every day | ORAL | Status: DC
  Administered 2016-12-20 – 2016-12-31 (×12): 20 meq via ORAL
  Filled 2016-12-19 (×12): qty 1

## 2016-12-19 MED ORDER — ACETAMINOPHEN 325 MG PO TABS: 650.0000 mg | ORAL_TABLET | Freq: Four times a day (QID) | ORAL | Status: DC | PRN

## 2016-12-19 MED ORDER — ACETAMINOPHEN 650 MG RE SUPP: 650.0000 mg | Freq: Four times a day (QID) | RECTAL | Status: DC | PRN

## 2016-12-19 MED ORDER — MOMETASONE FURO-FORMOTEROL FUM 200-5 MCG/ACT IN AERO
2.0000 | INHALATION_SPRAY | Freq: Two times a day (BID) | RESPIRATORY_TRACT | Status: DC
  Administered 2016-12-19 – 2016-12-20 (×2): 2 via RESPIRATORY_TRACT
  Filled 2016-12-19: qty 8.8

## 2016-12-19 MED ORDER — POLYETHYLENE GLYCOL 3350 17 G PO PACK: 17.0000 g | PACK | Freq: Every day | ORAL | Status: DC | PRN

## 2016-12-19 MED ORDER — CARVEDILOL 3.125 MG PO TABS
6.2500 mg | ORAL_TABLET | Freq: Two times a day (BID) | ORAL | Status: DC
Start: 2016-12-19 — End: 2016-12-31
  Administered 2016-12-19 – 2016-12-31 (×24): 6.25 mg via ORAL
  Filled 2016-12-19: qty 2
  Filled 2016-12-19 (×6): qty 1
  Filled 2016-12-19: qty 2
  Filled 2016-12-19 (×4): qty 1
  Filled 2016-12-19 (×2): qty 2
  Filled 2016-12-19: qty 1
  Filled 2016-12-19 (×2): qty 2
  Filled 2016-12-19 (×7): qty 1

## 2016-12-19 MED ORDER — ASPIRIN 81 MG PO CHEW
81.0000 mg | CHEWABLE_TABLET | Freq: Every day | ORAL | Status: DC
  Administered 2016-12-20 – 2016-12-30 (×11): 81 mg via ORAL
  Filled 2016-12-19 (×11): qty 1

## 2016-12-19 MED ORDER — SENNA 8.6 MG PO TABS
1.0000 | ORAL_TABLET | Freq: Two times a day (BID) | ORAL | Status: DC
  Administered 2016-12-19 – 2016-12-31 (×19): 8.6 mg via ORAL
  Filled 2016-12-19 (×23): qty 1

## 2016-12-19 MED ORDER — MELOXICAM 7.5 MG PO TABS
7.5000 mg | ORAL_TABLET | Freq: Every day | ORAL | Status: DC
  Administered 2016-12-20 – 2016-12-31 (×12): 7.5 mg via ORAL
  Filled 2016-12-19 (×12): qty 1

## 2016-12-19 MED ORDER — ALBUTEROL SULFATE (2.5 MG/3ML) 0.083% IN NEBU
2.5000 mg | INHALATION_SOLUTION | RESPIRATORY_TRACT | Status: DC | PRN
  Administered 2016-12-19: 2.5 mg via RESPIRATORY_TRACT
  Filled 2016-12-19: qty 3

## 2016-12-19 NOTE — Consult Note (Signed)
PULMONARY / CRITICAL CARE MEDICINE   Name: Sarajean Dessert MRN: [id number] DOB: 1935-10-23    ADMISSION DATE:  12/19/2016   CONSULTATION DATE:  12/19/2016  REFERRING MD:  Dr Posey Pronto  REASON: Acute respiratory failure  CHIEF COMPLAINT: Acute dyspnea    HISTORY OF PRESENT ILLNESS:   This is a 81 y/o female with a PMH as indicated below, currently undergoing radiation for presumed NSCLC, low EF congestive heart failure and on home O2 3-4L who presented to the ED with complaints of worsening dyspnea x 3 days. Upon EMS' arrival, she was hypoxic in the 70s on 3l [place]. She was placed on BiPAP and transferred to the ED. She is being admitted to the SDU for acute hypoxic respiratory failure and acute on chronic COPD and CHF exacerbation She denies fever, chills, nausea and vomiting but reports a mild cough PAST MEDICAL HISTORY :  She  has a past medical history of Emphysema lung ([place]) and History of COPD.  PAST SURGICAL HISTORY: She  has a past surgical history that includes Cholecystectomy and ear drum.  No Known Allergies  No current facility-administered medications on file prior to encounter.    Current Outpatient Prescriptions on File Prior to Encounter  Medication Sig  . albuterol (PROVENTIL) (2.5 MG/3ML) 0.083% nebulizer solution Take 2.5 mg by nebulization every 4 (four) hours as needed for wheezing or shortness of breath.  Marland Kitchen aspirin 81 MG chewable tablet Chew 81 mg by mouth at bedtime.  . carvedilol (COREG) 6.25 MG tablet Take 1 tablet (6.25 mg total) by mouth 2 (two) times daily with a meal.  . citalopram (CELEXA) 10 MG tablet Take 10 mg by mouth daily.  . Fluticasone-Salmeterol (ADVAIR) 250-50 MCG/DOSE AEPB Inhale 1 puff into the lungs 2 (two) times daily.  . furosemide (LASIX) 40 MG tablet Take 1 tablet (40 mg total) by mouth 2 (two) times daily.  Marland Kitchen ipratropium-albuterol (DUONEB) 0.5-2.5 (3) MG/3ML SOLN Take 3 mLs by nebulization every 4 (four) hours.  Marland Kitchen lisinopril  (PRINIVIL,ZESTRIL) 10 MG tablet daily.   . meloxicam (MOBIC) 7.5 MG tablet TAKE 1 TABLET(7.5 MG) BY MOUTH EVERY DAY  . montelukast (SINGULAIR) 10 MG tablet Take 10 mg by mouth at bedtime.  . potassium chloride SA (K-DUR,KLOR-CON) 20 MEQ tablet Take 1 tablet (20 mEq total) by mouth daily.  . pravastatin (PRAVACHOL) 20 MG tablet Take 20 mg by mouth at bedtime.  . temazepam (RESTORIL) 30 MG capsule Take 30 mg by mouth at bedtime as needed for sleep.  . calcium carbonate (TUMS - DOSED IN MG ELEMENTAL CALCIUM) 500 MG chewable tablet Chew 1 tablet by mouth as needed for indigestion or heartburn.    FAMILY HISTORY:  Her indicated that the status of her neg hx is unknown.    SOCIAL HISTORY: She  reports that she quit smoking about 11 years ago. She has never used smokeless tobacco. She reports that she does not drink alcohol.  REVIEW OF SYSTEMS:   Unable to obtain as patient is on continuous BiPAP  SUBJECTIVE:   VITAL SIGNS: BP (!) 122/98 (BP Location: Right Arm)   Pulse 84   Temp 97.6 F (36.4 C) (Axillary)   Resp (!) 31   Ht 5\' 4"  (1.626 m)   Wt 115 lb 15.4 oz (52.6 kg)   SpO2 100%   BMI 19.90 kg/m   HEMODYNAMICS:    VENTILATOR SETTINGS:    INTAKE / OUTPUT: No intake/output data recorded.  PHYSICAL EXAMINATION: General: Moderate respiratory distress Neuro:  AAO X3, Moves all extremities HEENT:  PERRLA, trachea midline Cardiovascular:  RRR, S1/S2, no MRG, +2 PULSES, +2 pitting edema Lungs:  Increased WOB, short inspiratory span, bilateral wheezes, no rales or rhonchi Abdomen:  Soft, +BS X4 Musculoskeletal:  +ROM Skin:  Warm and dry  LABS:  BMET  Recent Labs Lab 12/19/16 1356  NA 132*  K 4.4  CL 85*  CO2 36*  BUN 15  CREATININE 0.56  GLUCOSE 137*    Electrolytes  Recent Labs Lab 12/19/16 1356  CALCIUM 9.4    CBC  Recent Labs Lab 12/19/16 1356  WBC 9.2  HGB 13.3  HCT 39.7  PLT 264    Coag's No results for input(s): APTT, INR in the last  168 hours.  Sepsis Markers No results for input(s): LATICACIDVEN, PROCALCITON, O2SATVEN in the last 168 hours.  ABG No results for input(s): PHART, PCO2ART, PO2ART in the last 168 hours.  Liver Enzymes No results for input(s): AST, ALT, ALKPHOS, BILITOT, ALBUMIN in the last 168 hours.  Cardiac Enzymes  Recent Labs Lab 12/19/16 1356  TROPONINI <0.03    Glucose No results for input(s): GLUCAP in the last 168 hours.  Imaging Dg Chest Portable 1 View  Result Date: 12/19/2016 CLINICAL DATA:  Pt arrives [facility] emergency traffic from home for respiratory distress. Hx COPD. Pt took 2 duonebs at home, 2 puffs of 2 different inahlers (one known to be albuterol). Pt wears 3.5 L chronically. Was 72% on her 3.5 L. EMS placed pt on bipap, states pt has never been on bipap. Pt has lung masses in each lung. Is being treated for R sided mass. EMS reports expiratory wheezes and diminished on L side. EMS states pt wasn't able to talk much upon their arrival at home. EMS arrives talking in fragments. RT placed pt on [facility] bipap upon arrival. EMS reports pt has oncologist here at [facility].was truncated* EXAM: PORTABLE CHEST 1 VIEW COMPARISON:  09/25/2016 FINDINGS: Mild enlargement of the cardiopericardial silhouette. Atherosclerotic calcification of the aortic arch. Emphysema noted. Prior left upper lobe mass is still present but is less striking compared to the prior exam. The other pulmonary nodules are not readily apparent on conventional radiography. Edema is apparent. IMPRESSION: 1. Reduced size of left upper lobe mass. 2. Mild enlargement of the cardiopericardial silhouette. No overt edema, although the presence of the severe emphysema can mask pulmonary edema. 3.  Aortic Atherosclerosis (ICD10-I70.0). Electronically Signed   By: Van Clines M.D.   On: 12/19/2016 14:13     STUDIES:  2 D echo>>  CULTURES: none  ANTIBIOTICS: Levaquin forprophylaxis  SIGNIFICANT EVENTS: 10/09>admitted with  respiratory failure  LINES/TUBES: PIVs  DISCUSSION: 81 y/o with NSCLC admitted with acute on chronic diastolic and systolic heart failure, Acute on chronic COPD exacerbation and acute hypoxic respiratory failure  ASSESSMENT / PLAN: Acute hypoxic respiratory failure Acute CHF exacerbation Acute COPD exacerbation Uncontrolled hypertension NSCLC-on radiation therapy  Plan Continuous BiPAP and titrate to [place] as tolerated Hemodynamics per ICU protocol Nebulized bronchodilators IV and inhaled steroids Levaquin PO x 5 days IV diuresis Continue all home medications F/U ECHO Morphine prn for dyspnea GI AND DVT prophylaxis     Tracen Mahler S. [facility] ANP-BC Pulmonary and Critical Care Medicine [facility] Pager [phone] or [phone]  12/19/2016, 7:07 PM

## 2016-12-19 NOTE — Progress Notes (Signed)
Patient taken off BIPAP and placed on [place].  Patient did not tolerate,  RR increases to 33 and oxygen saturations dropped to 82% on 4lpm .

## 2016-12-19 NOTE — ED Notes (Addendum)
Pt states she sees Dr. Vella Kohler at [facility] for her lung masses. States she finished 7 rounds of radiation and is scheduled for Thursday to follow up with Dr. Vella Kohler to see what to do next. States had a scan 9 months ago which was clean but had a scan 3 months ago and that's when masses were discovered. Unsure if cancerous yet. Pt is talking in complete sentences, does have to pause in between sentences. Tolerating bipap well.   States SOB x 3 days with cough and congestion.

## 2016-12-19 NOTE — ED Notes (Signed)
RT at bedside. Removed bipap and placed pt on her normal oxygen. Pt dropped to 82%. Admitting MD at bedside. RT placed bipap back on pt.

## 2016-12-19 NOTE — ED Provider Notes (Signed)
[facility] Emergency Department Provider Note   ____________________________________________   First MD Initiated Contact with Patient 12/19/16 1405     (approximate)  I have reviewed the triage vital signs and the nursing notes.   HISTORY  Chief Complaint Respiratory Distress    HPI Brooke Trevino is a 81 y.o. female Reports 3 days ago actually increasing shortness of breath and a cough productive of some milky sputum. patient has not had fever. today she took 2 DuoNeb nebs at home and 2 puffs of different inhalers one of which was albuterol. She usually wears 3.5 L of oxygen and was 72% on her oxygen. EMS put her on BiPAP.patient has a history of lung massesreview of old records show that they're consistent with multifocal non-small cell lung cancer.   Past Medical History:  Diagnosis Date  . Emphysema lung ([place])   . History of COPD     Patient Active Problem List   Diagnosis Date Noted  . Emphysema lung ([place]) 11/03/2016  . Essential hypertension, benign 11/03/2016  . AAA (abdominal aortic aneurysm) without rupture ([place]) 11/03/2016  . Acute on chronic respiratory failure with hypoxia ([place]) 08/13/2015  . COPD exacerbation ([place]) 08/13/2015  . Cardiomyopathy ([place]) 08/13/2015  . Pneumonia 08/13/2015  . Leg swelling 08/13/2015  . Aortic stenosis 08/13/2015  . Acute systolic CHF (congestive heart failure) ([place]) 08/09/2015    Past Surgical History:  Procedure Laterality Date  . CHOLECYSTECTOMY    . ear drum     Ear drum replacement    Prior to Admission medications   Medication Sig Start Date End Date Taking? Authorizing Provider  albuterol (PROVENTIL) (2.5 MG/3ML) 0.083% nebulizer solution Take 2.5 mg by nebulization every 4 (four) hours as needed for wheezing or shortness of breath.   Yes [provider]  aspirin 81 MG chewable tablet Chew 81 mg by mouth at bedtime.   Yes [provider]  carvedilol (COREG) 6.25 MG  tablet Take 1 tablet (6.25 mg total) by mouth 2 (two) times daily with a meal. 08/13/15  Yes Theodoro Grist, MD  citalopram (CELEXA) 10 MG tablet Take 10 mg by mouth daily.   Yes [provider]  Fluticasone-Salmeterol (ADVAIR) 250-50 MCG/DOSE AEPB Inhale 1 puff into the lungs 2 (two) times daily.   Yes [provider]  furosemide (LASIX) 40 MG tablet Take 1 tablet (40 mg total) by mouth 2 (two) times daily. 08/13/15  Yes Theodoro Grist, MD  ipratropium-albuterol (DUONEB) 0.5-2.5 (3) MG/3ML SOLN Take 3 mLs by nebulization every 4 (four) hours.   Yes [provider]  lisinopril (PRINIVIL,ZESTRIL) 10 MG tablet daily.  11/02/16  Yes [provider]  meloxicam (MOBIC) 7.5 MG tablet TAKE 1 TABLET(7.5 MG) BY MOUTH EVERY DAY 07/21/16  Yes [provider]  montelukast (SINGULAIR) 10 MG tablet Take 10 mg by mouth at bedtime.   Yes [provider]  potassium chloride SA (K-DUR,KLOR-CON) 20 MEQ tablet Take 1 tablet (20 mEq total) by mouth daily. 08/13/15  Yes Theodoro Grist, MD  pravastatin (PRAVACHOL) 20 MG tablet Take 20 mg by mouth at bedtime.   Yes [provider]  temazepam (RESTORIL) 30 MG capsule Take 30 mg by mouth at bedtime as needed for sleep.   Yes [provider]  calcium carbonate (TUMS - DOSED IN MG ELEMENTAL CALCIUM) 500 MG chewable tablet Chew 1 tablet by mouth as needed for indigestion or heartburn.    [provider]    Allergies Patient has  no known allergies.  Family History  Problem Relation Age of Onset  . Breast cancer Neg Hx     Social History Social History  Substance Use Topics  . Smoking status: Former Smoker    Quit date: 08/08/2005  . Smokeless tobacco: Never Used  . Alcohol use No    Review of Systems  Constitutional: No fever/chills Eyes: No visual changes. ENT: No sore throat. Cardiovascular: Denies chest pain. Respiratory: shortness of breath. Gastrointestinal: No abdominal pain.  No  nausea, no vomiting.  No diarrhea.  No constipation. Genitourinary: Negative for dysuria. Musculoskeletal: Negative for back pain. Skin: Negative for rash. Neurological: Negative for headaches, focal weakness or numbness.   ____________________________________________   PHYSICAL EXAM:  VITAL SIGNS: ED Triage Vitals [12/19/16 1357]  Enc Vitals Group     BP      Pulse      Resp      Temp      Temp src      SpO2      Weight      Height      Head Circumference      Peak Flow      Pain Score 0     Pain Loc      Pain Edu?      Excl. in [place]?    Constitutional: Alert and oriented. Well appearing and in  acute  Respiratory distress. Eyes: Conjunctivae are normal.  Head: Atraumatic. Nose: No congestion/rhinnorhea. Mouth/Throat: Mucous membranes are moist.  Oropharynx non-erythematous. Neck: No stridor.   Cardiovascular: Normal rate, regular rhythm. Grossly normal heart sounds.  Good peripheral circulation. Respiratory:increasedrespiratory effort.  retractions. Lungs decreased breath sounds Gastrointestinal: Soft and nontender. No distention. No abdominal bruits. No CVA tenderness. Musculoskeletal: No lower extremity tenderness bilateral edema to the knees.  No joint effusions. Neurologic:  Normal speech and language. No gross focal neurologic deficits are appreciated.  Skin:  Skin is warm, dry and intact. No rash noted. Psychiatric: Mood and affect are normal. Speech and behavior are normal.  ____________________________________________   LABS (all labs ordered are listed, but only abnormal results are displayed)  Labs Reviewed  BRAIN NATRIURETIC PEPTIDE - Abnormal; Notable for the following:       Result Value   B Natriuretic Peptide 930.0 (*)    All other components within normal limits  CBC  BASIC METABOLIC PANEL  TROPONIN I   ____________________________________________  EKG  EKG read and interpreted by me shows normal sinus rhythm rate of 98 left axis prolonged  QRS duration computer is reading ST elevation possible pericarditis but the baseline is too irregular for me to confirm this patient has no chest pain ____________________________________________  RADIOLOGY  asked x-ray shows apparently increased vascular markings ____________________________________________   PROCEDURES  Procedure(s) performed:   Procedures  Critical Care performed: ]  ____________________________________________   INITIAL IMPRESSION / ASSESSMENT AND PLAN / ED COURSE  As part of my medical decision making, I reviewed the following data within the [place] old records and clinic notes were reviewed and this is a help especially when comes to discuss the likely etiology of the lung masses         ____________________________________________   FINAL CLINICAL IMPRESSION(S) / ED DIAGNOSES  Final diagnoses:  Systolic congestive heart failure, unspecified HF chronicity (HCC)      NEW MEDICATIONS STARTED DURING THIS VISIT:  New Prescriptions   No medications on file     Note:  This document was prepared using  [employer]agon [employer] forces training and education officer and may include unintentional dictation errors.    Nena Polio, MD 12/19/16 1537

## 2016-12-19 NOTE — ED Notes (Signed)
Dr. Cinda Quest wants to trial pt off bipap. RT notified.

## 2016-12-19 NOTE — H&P (Signed)
[facility] [place] NAME: Brooke Trevino    MR#:  [id number]  DATE OF BIRTH:  01/14/1936  DATE OF ADMISSION:  12/19/2016  PRIMARY CARE PHYSICIAN: Sofie Hartigan, MD   REQUESTING/REFERRING PHYSICIAN: Dr. Rip Harbour  CHIEF COMPLAINT:  Increasing shortness of breath for 3-4 days worse today  HISTORY OF PRESENT ILLNESS:  Brooke Trevino  is a 81 y.o. female with a known history of severe chronic emphysema on 3.5 L nasal Oxygen, Cardiomyopathy with EF of 25%, Multiple Lesions in the Lung Being Treated Empirically by Radiation Therapy for Presumed Non-Small cell Lung Cancer by Dr. Donella Stade, Known History of AAA Followed by [facility] to the emergency room with increasing shortness of breath for 2 or 3 days. Patient said symptoms got worse today and EMS was called sats were 72% on 3.5 L nasal Oxygen. Patient was placed on BiPAP came to the emergency room was found to be in acute on chronic respiratory failure hypoxic secondary to CHF and acute on chronic systolic and COPD exacerbation She is placed on BiPAP. BiPAP was being off patient became very short of breath with increased use of accessory muscle placed back on BiPAP and now being admitted in a stepdown ICU.  Patient's daughter is present in the emergency room. PAST MEDICAL HISTORY:   Past Medical History:  Diagnosis Date  . Emphysema lung ([place])   . History of COPD     PAST SURGICAL HISTOIRY:   Past Surgical History:  Procedure Laterality Date  . CHOLECYSTECTOMY    . ear drum     Ear drum replacement    SOCIAL HISTORY:   Social History  Substance Use Topics  . Smoking status: Former Smoker    Quit date: 08/08/2005  . Smokeless tobacco: Never Used  . Alcohol use No    FAMILY HISTORY:   Family History  Problem Relation Age of Onset  . Breast cancer Neg Hx     DRUG ALLERGIES:  No Known Allergies  REVIEW OF SYSTEMS:  Review of Systems  Constitutional: Negative  for chills, fever and weight loss.  HENT: Negative for ear discharge, ear pain and nosebleeds.   Eyes: Negative for blurred vision, pain and discharge.  Respiratory: Positive for cough, shortness of breath and wheezing. Negative for sputum production and stridor.   Cardiovascular: Positive for leg swelling. Negative for chest pain, palpitations, orthopnea and PND.  Gastrointestinal: Negative for abdominal pain, diarrhea, nausea and vomiting.  Genitourinary: Negative for frequency and urgency.  Musculoskeletal: Negative for back pain and joint pain.  Neurological: Positive for weakness. Negative for sensory change, speech change and focal weakness.  Psychiatric/Behavioral: Negative for depression and hallucinations. The patient is not nervous/anxious.      MEDICATIONS AT HOME:   Prior to Admission medications   Medication Sig Start Date End Date Taking? Authorizing Provider  albuterol (PROVENTIL) (2.5 MG/3ML) 0.083% nebulizer solution Take 2.5 mg by nebulization every 4 (four) hours as needed for wheezing or shortness of breath.   Yes [provider]  aspirin 81 MG chewable tablet Chew 81 mg by mouth at bedtime.   Yes [provider]  carvedilol (COREG) 6.25 MG tablet Take 1 tablet (6.25 mg total) by mouth 2 (two) times daily with a meal. 08/13/15  Yes Theodoro Grist, MD  citalopram (CELEXA) 10 MG tablet Take 10 mg by mouth daily.   Yes [provider]  Fluticasone-Salmeterol (ADVAIR) 250-50 MCG/DOSE AEPB Inhale 1 puff into the lungs 2 (  two) times daily.   Yes [provider]  furosemide (LASIX) 40 MG tablet Take 1 tablet (40 mg total) by mouth 2 (two) times daily. 08/13/15  Yes Theodoro Grist, MD  ipratropium-albuterol (DUONEB) 0.5-2.5 (3) MG/3ML SOLN Take 3 mLs by nebulization every 4 (four) hours.   Yes [provider]  lisinopril (PRINIVIL,ZESTRIL) 10 MG tablet daily.  11/02/16  Yes [provider]  meloxicam (MOBIC) 7.5 MG tablet TAKE 1  TABLET(7.5 MG) BY MOUTH EVERY DAY 07/21/16  Yes [provider]  montelukast (SINGULAIR) 10 MG tablet Take 10 mg by mouth at bedtime.   Yes [provider]  potassium chloride SA (K-DUR,KLOR-CON) 20 MEQ tablet Take 1 tablet (20 mEq total) by mouth daily. 08/13/15  Yes Theodoro Grist, MD  pravastatin (PRAVACHOL) 20 MG tablet Take 20 mg by mouth at bedtime.   Yes [provider]  temazepam (RESTORIL) 30 MG capsule Take 30 mg by mouth at bedtime as needed for sleep.   Yes [provider]  calcium carbonate (TUMS - DOSED IN MG ELEMENTAL CALCIUM) 500 MG chewable tablet Chew 1 tablet by mouth as needed for indigestion or heartburn.    [provider]      VITAL SIGNS:  Blood pressure (!) 140/93, pulse 86, temperature 97.6 F (36.4 C), temperature source Axillary, resp. rate (!) 25, SpO2 98 %.  PHYSICAL EXAMINATION:  GENERAL:  81 y.o.-year-old patient lying in the bed with no acute distress. Thin cachectic critically ill EYES: Pupils equal, round, reactive to light and accommodation. No scleral icterus. Extraocular muscles intact.  HEENT: Head atraumatic, normocephalic. Oropharynx and nasopharynx clear.  NECK:  Supple, no jugular venous distention. No thyroid enlargement, no tenderness.  LUNGS:distant breath sounds bilaterally, no wheezing, rales,rhonchi or crepitation. ++ use of accessory muscles of respiration. On BiPAP CARDIOVASCULAR: S1, S2 normal. No murmurs, rubs, or gallops.  ABDOMEN: Soft, nontender, nondistended. Bowel sounds present. No organomegaly or mass.  EXTREMITIES: ++ pedal edema, positive for cyanosis feet NEUROLOGIC: Cranial nerves II through XII are intact. Muscle strength 5/5 in all extremities. Sensation intact. Gait not checked.  PSYCHIATRIC: The patient is alert and oriented x 3.  SKIN: No obvious rash, lesion, or ulcer.   LABORATORY PANEL:   CBC  Recent Labs Lab 12/19/16 1356  WBC 9.2  HGB 13.3  HCT 39.7  PLT 264    ------------------------------------------------------------------------------------------------------------------  Chemistries  No results for input(s): NA, K, CL, CO2, GLUCOSE, BUN, CREATININE, CALCIUM, MG, AST, ALT, ALKPHOS, BILITOT in the last 168 hours.  Invalid input(s): GFRCGP ------------------------------------------------------------------------------------------------------------------  Cardiac Enzymes No results for input(s): TROPONINI in the last 168 hours. ------------------------------------------------------------------------------------------------------------------  RADIOLOGY:  Dg Chest Portable 1 View  Result Date: 12/19/2016 CLINICAL DATA:  Pt arrives [facility] emergency traffic from home for respiratory distress. Hx COPD. Pt took 2 duonebs at home, 2 puffs of 2 different inahlers (one known to be albuterol). Pt wears 3.5 L chronically. Was 72% on her 3.5 L. EMS placed pt on bipap, states pt has never been on bipap. Pt has lung masses in each lung. Is being treated for R sided mass. EMS reports expiratory wheezes and diminished on L side. EMS states pt wasn't able to talk much upon their arrival at home. EMS arrives talking in fragments. RT placed pt on [facility] bipap upon arrival. EMS reports pt has oncologist here at [facility].was truncated* EXAM: PORTABLE CHEST 1 VIEW COMPARISON:  09/25/2016 FINDINGS: Mild enlargement of the cardiopericardial silhouette. Atherosclerotic calcification of the aortic arch. Emphysema noted. Prior  left upper lobe mass is still present but is less striking compared to the prior exam. The other pulmonary nodules are not readily apparent on conventional radiography. Edema is apparent. IMPRESSION: 1. Reduced size of left upper lobe mass. 2. Mild enlargement of the cardiopericardial silhouette. No overt edema, although the presence of the severe emphysema can mask pulmonary edema. 3.  Aortic Atherosclerosis (ICD10-I70.0). Electronically Signed   By: Van Clines M.D.   On: 12/19/2016 14:13    EKG:    IMPRESSION AND PLAN:   Brooke Trevino  is a 81 y.o. female with a known history of severe chronic emphysema on 3.5 L nasal Oxygen, Cardiomyopathy with EF of 25%, Multiple Lesions in the Lung Being Treated Empirically by Radiation Therapy for Presumed Non-Small cell Lung Cancer , Known History of AAA Followed by [facility] to the emergency room with increasing shortness of breath for 2 or 3 days. Patient said symptoms got worse today and EMS was called sats were 72% on 3.5 L nasal Oxygen. Patient was placed on BiPAP came to the emergency room was found to be in acute on chronic respiratory failure hypoxic secondary to CHF and acute on chronic systolic and COPD exacerbation  1. Acute on chronic hypoxic respiratory failure secondary to COPD exacerbation and acute on chronic systolic congestive heart failure -admit to ICU stepdown -Continue BiPAP and wean as tolerated -ICU attending Dr.  Raliegh Ip ASA notified -IV Lasix 20 milligrams 3 times a day -IV Solu-Medrol 40 mg 3 times a day, nebulizer, inhalers, Spiriva -Monitor I's and O's -Echo  2. Acute on chronic systolic congestive heart failure -Patient has known EF of 20-25% by echo of January 2017 -Continue lisinopril, Coreg, IV Lasix monitor I's and O's and daily weights  3. Known multiple lung lesions being treated empirically for presumed non-small cell lung cancer -Patient just finished radiation therapy for the right lung at the cancer center -She is being worked up by Dr. Raul Del for questionable cavitary lesion on the left side -Patient completed multiple courses of antibiotics with Ceftin Avelox Levaquin recently over the last severalWeeks. -Patient does not seem to have any bacterial infection at present I will hold off on antibiotics  4. Known history of AAA 3.5 cm per last ultrasound abdomen -Patient follows with vascular surgery Dr. Lucky Cowboy   5. Known cardiac myopathy Continue Coreg  and Lasix lisinopril -Consider adding Spironolactone  6. DVT prophylaxis subcutaneous Lovenox   All the records are reviewed and case discussed with ED provider. Management plans discussed with the patient, family and they are in agreement.  CODE STATUS: patient wants to remain full code. This was discussed at length with patient and daughter present in the emergency room  TOTAL criticalTIME TAKING CARE OF THIS PATIENT: 0 minutes.    Brooke Trevino M.D on 12/19/2016 at 3:40 PM  Between 7am to 6pm - Pager - [phone]  After 6pm go to [email or web address] - password [email or web address] [facility]  [facility]  Office  [phone]  CC: Primary care physician; Sofie Hartigan, MD

## 2016-12-19 NOTE — ED Notes (Signed)
External catheter applied via VO from Dr. Posey Pronto

## 2016-12-19 NOTE — ED Triage Notes (Signed)
Pt arrives [facility] emergency traffic from home for respiratory distress. Hx COPD. Pt took 2 duonebs at home, 2 puffs of 2 different inahlers (one known to be albuterol). Pt wears 3.5 L chronically. Was 72% on her 3.5 L. EMS placed pt on bipap, states pt has never been on bipap. Pt has lung masses in each lung. Is being treated for R sided mass. EMS reports expiratory wheezes and diminished on L side. EMS states pt wasn't able to talk much upon their arrival at home. EMS arrives talking in fragments. RT placed pt on [facility] bipap upon arrival. EMS reports pt has oncologist here at [facility].   EMS VS 180/88, CBG 170, 99% on bipap, HR in 80's NSR.

## 2016-12-20 ENCOUNTER — Inpatient Hospital Stay
Admit: 2016-12-20 | Discharge: 2016-12-20 | Disposition: A | Discharge status: Home / Self Care | Payer: Medicare Other | Attending: Internal Medicine | Admitting: Internal Medicine

## 2016-12-20 LAB — PROCALCITONIN

## 2016-12-20 LAB — BASIC METABOLIC PANEL WITH GFR
Anion gap: 13 (ref 5–15)
BUN: 14 mg/dL (ref 6–20)
CO2: 36 mmol/L — ABNORMAL HIGH (ref 22–32)
Calcium: 9 mg/dL (ref 8.9–10.3)
Chloride: 87 mmol/L — ABNORMAL LOW (ref 101–111)
Creatinine, Ser: 0.51 mg/dL (ref 0.44–1.00)
GFR calc Af Amer: >60 mL/min
GFR calc non Af Amer: >60 mL/min
Glucose, Bld: 116 mg/dL — ABNORMAL HIGH (ref 65–99)
Potassium: 3.5 mmol/L (ref 3.5–5.1)
Sodium: 136 mmol/L (ref 135–145)

## 2016-12-20 LAB — GLUCOSE, CAPILLARY: GLUCOSE-CAPILLARY: 100 mg/dL — AB (ref 65–99)

## 2016-12-20 LAB — TROPONIN I: Troponin I: <0.03 ng/mL (ref <0.03)

## 2016-12-20 MED ORDER — PNEUMOCOCCAL VAC POLYVALENT 25 MCG/0.5ML IJ INJ
0.5000 mL | INJECTION | INTRAMUSCULAR | Status: DC
Start: 2016-12-21 — End: 2016-12-25

## 2016-12-20 MED ORDER — INFLUENZA VAC SPLIT HIGH-DOSE 0.5 ML IM SUSY
0.5000 mL | PREFILLED_SYRINGE | INTRAMUSCULAR | Status: DC
  Filled 2016-12-20: qty 0.5

## 2016-12-20 MED ORDER — BUDESONIDE 0.5 MG/2ML IN SUSP
0.5000 mg | Freq: Two times a day (BID) | RESPIRATORY_TRACT | Status: DC
  Administered 2016-12-20 – 2016-12-31 (×23): 0.5 mg via RESPIRATORY_TRACT
  Filled 2016-12-20 (×24): qty 2

## 2016-12-20 MED ORDER — LEVOFLOXACIN 750 MG PO TABS
750.0000 mg | ORAL_TABLET | Freq: Every day | ORAL | Status: DC
  Administered 2016-12-20: 750 mg via ORAL
  Filled 2016-12-20: qty 1

## 2016-12-20 MED ORDER — IPRATROPIUM-ALBUTEROL 0.5-2.5 (3) MG/3ML IN SOLN
3.0000 mL | RESPIRATORY_TRACT | Status: DC
  Administered 2016-12-20 – 2016-12-25 (×29): 3 mL via RESPIRATORY_TRACT
  Filled 2016-12-20 (×31): qty 3

## 2016-12-20 NOTE — Progress Notes (Signed)
Pharmacy Antibiotic Note  Toni Demo is a 81 y.o. female admitted on 12/19/2016 with SOB.  Plan: Discontinue levofloxacin due to PCT <0.10.   Height: 5\' 4"  (162.6 cm) Weight: 115 lb 15.4 oz (52.6 kg) IBW/kg (Calculated) : 54.7  Temp (24hrs), Avg:98.2 F (36.8 C), Min:97.5 F (36.4 C), Max:98.7 F (37.1 C)   Recent Labs Lab 12/19/16 1356 12/20/16 0102  WBC 9.2  --   CREATININE 0.56 0.51    Estimated Creatinine Clearance: 47.3 mL/min (by C-G formula based on SCr of 0.51 mg/dL).    No Known Allergies  Antimicrobials this admission: Levofloxacin 10/10 X 1  Microbiology results: 10/9 MRSA PCR: negative  Thank you for allowing pharmacy to be a part of this patient's care.  Oralia Manis 12/20/2016 2:31 PM

## 2016-12-20 NOTE — Progress Notes (Signed)
PT Cancellation Note  Patient Details Name: Brooke Trevino MRN: [id number] DOB: [birth year]   Cancelled Treatment:    Reason Eval/Treat Not Completed: Patient declined, no reason specified; Pt refused evaluation, "please don't make me do this"; pt given breathing treatment prior to attempted session. Will re-attempt at a later time/date.    Wetzel Bjornstad 12/20/2016, 11:59 AM

## 2016-12-20 NOTE — Progress Notes (Signed)
*  PRELIMINARY RESULTS* Echocardiogram 2D Echocardiogram has been performed.  Brooke Trevino 12/20/2016, 3:08 PM

## 2016-12-20 NOTE — Progress Notes (Signed)
[facility] at [place] NAME: Brooke Trevino    MR#:  [id number]  DATE OF BIRTH:  1935-12-28  SUBJECTIVE:  CHIEF COMPLAINT:   Chief Complaint  Patient presents with  . Respiratory Distress    Came with severe SOB. Initially required bipap, but was on HFNC when seen around 2 pm.  REVIEW OF SYSTEMS:  CONSTITUTIONAL: No fever, fatigue or weakness.  EYES: No blurred or double vision.  EARS, NOSE, AND THROAT: No tinnitus or ear pain.  RESPIRATORY: No cough,positive for shortness of breath, wheezing , no hemoptysis.  CARDIOVASCULAR: No chest pain, orthopnea, edema.  GASTROINTESTINAL: No nausea, vomiting, diarrhea or abdominal pain.  GENITOURINARY: No dysuria, hematuria.  ENDOCRINE: No polyuria, nocturia,  HEMATOLOGY: No anemia, easy bruising or bleeding SKIN: No rash or lesion. MUSCULOSKELETAL: No joint pain or arthritis.   NEUROLOGIC: No tingling, numbness, weakness.  PSYCHIATRY: No anxiety or depression.   ROS  DRUG ALLERGIES:  No Known Allergies  VITALS:  Blood pressure (!) 151/87, pulse 98, temperature (!) 97.5 F (36.4 C), temperature source Axillary, resp. rate (!) 21, height 5\' 4"  (1.626 m), weight 52.6 kg (115 lb 15.4 oz), SpO2 94 %.  PHYSICAL EXAMINATION:   GENERAL:  81 y.o.-year-old patient lying in the bed with no acute distress. Thin cachectic critically ill EYES: Pupils equal, round, reactive to light and accommodation. No scleral icterus. Extraocular muscles intact.  HEENT: Head atraumatic, normocephalic. Oropharynx and nasopharynx clear.  NECK:  Supple, no jugular venous distention. No thyroid enlargement, no tenderness.  LUNGS:distant breath sounds bilaterally, no wheezing, rales,rhonchi or crepitation. ++ use of accessory muscles of respiration. On HFNC. CARDIOVASCULAR: S1, S2 normal. No murmurs, rubs, or gallops.  ABDOMEN: Soft, nontender, nondistended. Bowel sounds present. No organomegaly or mass.  EXTREMITIES: ++  pedal edema, positive for cyanosis feet NEUROLOGIC: Cranial nerves II through XII are intact. Muscle strength 5/5 in all extremities. Sensation intact. Gait not checked.  PSYCHIATRIC: The patient is alert and oriented x 3.  SKIN: No obvious rash, lesion, or ulcer.   Physical Exam LABORATORY PANEL:   CBC  Recent Labs Lab 12/19/16 1356  WBC 9.2  HGB 13.3  HCT 39.7  PLT 264   ------------------------------------------------------------------------------------------------------------------  Chemistries   Recent Labs Lab 12/20/16 0102  NA 136  K 3.5  CL 87*  CO2 36*  GLUCOSE 116*  BUN 14  CREATININE 0.51  CALCIUM 9.0   ------------------------------------------------------------------------------------------------------------------  Cardiac Enzymes  Recent Labs Lab 12/20/16 0102 12/20/16 0840  TROPONINI <0.03 <0.03   ------------------------------------------------------------------------------------------------------------------  RADIOLOGY:  Dg Chest Portable 1 View  Result Date: 12/19/2016 CLINICAL DATA:  Pt arrives [facility] emergency traffic from home for respiratory distress. Hx COPD. Pt took 2 duonebs at home, 2 puffs of 2 different inahlers (one known to be albuterol). Pt wears 3.5 L chronically. Was 72% on her 3.5 L. EMS placed pt on bipap, states pt has never been on bipap. Pt has lung masses in each lung. Is being treated for R sided mass. EMS reports expiratory wheezes and diminished on L side. EMS states pt wasn't able to talk much upon their arrival at home. EMS arrives talking in fragments. RT placed pt on [facility] bipap upon arrival. EMS reports pt has oncologist here at [facility].was truncated* EXAM: PORTABLE CHEST 1 VIEW COMPARISON:  09/25/2016 FINDINGS: Mild enlargement of the cardiopericardial silhouette. Atherosclerotic calcification of the aortic arch. Emphysema noted. Prior left upper lobe mass is still present but is less striking compared to the  prior exam. The  other pulmonary nodules are not readily apparent on conventional radiography. Edema is apparent. IMPRESSION: 1. Reduced size of left upper lobe mass. 2. Mild enlargement of the cardiopericardial silhouette. No overt edema, although the presence of the severe emphysema can mask pulmonary edema. 3.  Aortic Atherosclerosis (ICD10-I70.0). Electronically Signed   By: Van Clines M.D.   On: 12/19/2016 14:13    ASSESSMENT AND PLAN:   Active Problems:   Acute and chronic respiratory failure (acute-on-chronic) ([place])  1. Acute on chronic hypoxic respiratory failure secondary to COPD exacerbation and acute on chronic systolic congestive heart failure  -Continue BiPAP and wean as tolerated- now on HFNC. -IV Lasix  -IV Solu-Medrol , nebulizer, inhalers, Spiriva -Monitor I's and O's -Echo  2. Acute on chronic systolic congestive heart failure -Patient has known EF of 20-25% by echo of January 2017 -Continue lisinopril, Coreg, IV Lasix monitor I's and O's and daily weights  3. Known multiple lung lesions being treated empirically for presumed non-small cell lung cancer -Patient just finished radiation therapy for the right lung at the cancer center -She is being worked up by Dr. Raul Del for questionable cavitary lesion on the left side -Patient completed multiple courses of antibiotics with Ceftin Avelox Levaquin recently over the last severalWeeks. -Patient does not seem to have any bacterial infection at present I will hold off on antibiotics  4. Known history of AAA 3.5 cm per last ultrasound abdomen -Patient follows with vascular surgery Dr. Lucky Cowboy   5. Known cardiac myopathy Continue Coreg and Lasix lisinopril -Consider adding Spironolactone  6. DVT prophylaxis subcutaneous Lovenox    All the records are reviewed and case discussed with Care Management/Social Workerr. Management plans discussed with the patient, family and they are in agreement.  CODE STATUS: full.  TOTAL  TIME TAKING CARE OF THIS PATIENT: 35 minutes.   POSSIBLE D/C IN 1-2 DAYS, DEPENDING ON CLINICAL CONDITION.   Vaughan Basta M.D on 12/20/2016   Between 7am to 6pm - Pager - [phone]  After 6pm go to [email or web address] - password [email or web address] [facility]  Office  [phone]  CC: Primary care physician; Sofie Hartigan, MD  Note: This dictation was prepared with Dragon dictation along with smaller phrase technology. Any transcriptional errors that result from this process are unintentional.

## 2016-12-21 ENCOUNTER — Ambulatory Visit: Admission: RE | Admit: 2016-12-21 | Payer: Medicare Other | Source: Ambulatory Visit | Admitting: Radiation Oncology

## 2016-12-21 LAB — ECHOCARDIOGRAM COMPLETE
Height: 64 in
Weight: 1855.39 oz

## 2016-12-21 MED ORDER — ACETAZOLAMIDE SODIUM 500 MG IJ SOLR
500.0000 mg | Freq: Once | INTRAMUSCULAR | Status: AC
  Administered 2016-12-21: 500 mg via INTRAVENOUS
  Filled 2016-12-21: qty 500

## 2016-12-21 MED ORDER — METHYLPREDNISOLONE SODIUM SUCC 40 MG IJ SOLR
40.0000 mg | Freq: Two times a day (BID) | INTRAMUSCULAR | Status: DC
  Administered 2016-12-21 – 2016-12-24 (×7): 40 mg via INTRAVENOUS
  Filled 2016-12-21 (×7): qty 1

## 2016-12-21 NOTE — Progress Notes (Signed)
Pnt awake and pleasant. Pnt requesting bi-pap be removed and place HFNC. Pnt currently w/HFNC 50% and sats 97%. Pnt appears comfortable. No UOP noted from pure wick, pnt had voided on pad, purewik was not in place. New purewik applied and bed pad changed. Purewik connected to high suctioning. No other issues or concerns at this time will continue to monitor.

## 2016-12-21 NOTE — Progress Notes (Signed)
[place] [facility] [place] NAME: Brooke Trevino    MR#:  [id number]  DATE OF BIRTH:  Jan 14, 1936  SUBJECTIVE:  CHIEF COMPLAINT:   Chief Complaint  Patient presents with  . Respiratory Distress    Came with severe SOB. Initially required bipap, Tried to put on HFNC, but still intermittently required Bipap.  REVIEW OF SYSTEMS:  CONSTITUTIONAL: No fever, fatigue or weakness.  EYES: No blurred or double vision.  EARS, NOSE, AND THROAT: No tinnitus or ear pain.  RESPIRATORY: No cough,positive for shortness of breath, wheezing , no hemoptysis.  CARDIOVASCULAR: No chest pain, orthopnea, edema.  GASTROINTESTINAL: No nausea, vomiting, diarrhea or abdominal pain.  GENITOURINARY: No dysuria, hematuria.  ENDOCRINE: No polyuria, nocturia,  HEMATOLOGY: No anemia, easy bruising or bleeding SKIN: No rash or lesion. MUSCULOSKELETAL: No joint pain or arthritis.   NEUROLOGIC: No tingling, numbness, weakness.  PSYCHIATRY: No anxiety or depression.   ROS  DRUG ALLERGIES:  No Known Allergies  VITALS:  Blood pressure 118/76, pulse 79, temperature (!) 97.5 F (36.4 C), temperature source Axillary, resp. rate (!) 28, height 5\' 4"  (1.626 m), weight 52.6 kg (115 lb 15.4 oz), SpO2 94 %.  PHYSICAL EXAMINATION:   GENERAL:  81 y.o.-year-old patient lying in the bed with no acute distress. Thin cachectic critically ill EYES: Pupils equal, round, reactive to light and accommodation. No scleral icterus. Extraocular muscles intact.  HEENT: Head atraumatic, normocephalic. Oropharynx and nasopharynx clear.  NECK:  Supple, no jugular venous distention. No thyroid enlargement, no tenderness.  LUNGS:distant breath sounds bilaterally, no wheezing, rales,rhonchi or crepitation. ++ use of accessory muscles of respiration. On Bipap again today. CARDIOVASCULAR: S1, S2 normal. No murmurs, rubs, or gallops.  ABDOMEN: Soft, nontender, nondistended. Bowel sounds present. No  organomegaly or mass.  EXTREMITIES: ++ pedal edema, positive for cyanosis feet NEUROLOGIC: Cranial nerves II through XII are intact. Muscle strength 5/5 in all extremities. Sensation intact. Gait not checked.  PSYCHIATRIC: The patient is alert and oriented x 3.  SKIN: No obvious rash, lesion, or ulcer.   Physical Exam LABORATORY PANEL:   CBC  Recent Labs Lab 12/19/16 1356  WBC 9.2  HGB 13.3  HCT 39.7  PLT 264   ------------------------------------------------------------------------------------------------------------------  Chemistries   Recent Labs Lab 12/20/16 0102  NA 136  K 3.5  CL 87*  CO2 36*  GLUCOSE 116*  BUN 14  CREATININE 0.51  CALCIUM 9.0   ------------------------------------------------------------------------------------------------------------------  Cardiac Enzymes  Recent Labs Lab 12/20/16 0102 12/20/16 0840  TROPONINI <0.03 <0.03   ------------------------------------------------------------------------------------------------------------------  RADIOLOGY:  No results found.  ASSESSMENT AND PLAN:   Active Problems:   Acute and chronic respiratory failure (acute-on-chronic) (HCC)  1. Acute on chronic hypoxic respiratory failure secondary to COPD exacerbation and acute on chronic systolic congestive heart failure  -Continue BiPAP and wean as tolerated- now on HFNC. -IV Lasix  -IV Solu-Medrol , nebulizer, inhalers, Spiriva -Monitor I's and O's -Echo reviewed EF 25%  2. Acute on chronic systolic congestive heart failure -Patient has known EF of 20-25% by echo of January 2017 and now. -Continue lisinopril, Coreg, IV Lasix monitor I's and O's and daily weights  3. Known multiple lung lesions being treated empirically for presumed non-small cell lung cancer -Patient just finished radiation therapy for the right lung at the cancer center -She is being worked up by Dr. Raul Del for questionable cavitary lesion on the left side -Patient  completed multiple courses of antibiotics with Ceftin Avelox Levaquin recently over  the last severalWeeks. -Patient does not seem to have any bacterial infection at present I will hold off on antibiotics  4. Known history of AAA 3.5 cm per last ultrasound abdomen -Patient follows with vascular surgery Dr. Lucky Cowboy   5. Known cardiac myopathy Continue Coreg and Lasix lisinopril -Consider adding Spironolactone  6. DVT prophylaxis subcutaneous Lovenox  Prognosis very poor, called palliative care consult.  All the records are reviewed and case discussed with Care Management/Social Workerr. Management plans discussed with the patient, family and they are in agreement.  CODE STATUS: full.  TOTAL TIME TAKING CARE OF THIS PATIENT: 35 minutes.   POSSIBLE D/C IN 1-2 DAYS, DEPENDING ON CLINICAL CONDITION.   Vaughan Basta M.D on 12/21/2016   Between 7am to 6pm - Pager - [phone]  After 6pm go to [email or web address] - password [email or web address] [facility]  Office  [phone]  CC: Primary care physician; Sofie Hartigan, MD  Note: This dictation was prepared with Dragon dictation along with smaller phrase technology. Any transcriptional errors that result from this process are unintentional.

## 2016-12-21 NOTE — Progress Notes (Signed)
Pnt rested this night no concerns or issues. Bipap on and tol well.

## 2016-12-21 NOTE — Care Management (Signed)
RNCM following. Patient currently in ICU unable to work with PT. HF O2 at 50%. Patient is on chronic supplemental O2 at home ~3.5 L.

## 2016-12-21 NOTE — Progress Notes (Signed)
PT Cancellation Note  Patient Details Name: Brooke Trevino MRN: [id number] DOB: Oct 05, 1935   Cancelled Treatment:    Reason Eval/Treat Not Completed: Medical issues which prohibited therapy; Pt on BiPAP, BP 179/119; nursing advised holding PT services at this time. Will re-attempt PT evaluation at a later time/date as medically appropriate.   Wetzel Bjornstad, SPT 12/21/2016, 11:09 AM

## 2016-12-21 NOTE — Progress Notes (Signed)
This am patient wore HFNC until 1100. At that time she became anxious, labored and tachypneic. Was then placed on Bipap, given morphine 2mg  and IV diamox.  She felt better after 30 min but was reluctant to have bipap off until 1730 at meal time. She is currently doing well on HFNC. Lungs clear and diminished. uop adequate but decreased. VSS. No c/o pain. Appetite poor

## 2016-12-22 ENCOUNTER — Inpatient Hospital Stay: Payer: Medicare Other

## 2016-12-22 LAB — BASIC METABOLIC PANEL
Anion gap: 11 (ref 5–15)
BUN: 31 mg/dL — AB (ref 6–20)
CO2: 38 mmol/L — ABNORMAL HIGH (ref 22–32)
CREATININE: 0.69 mg/dL (ref 0.44–1.00)
Calcium: 9.4 mg/dL (ref 8.9–10.3)
Chloride: 86 mmol/L — ABNORMAL LOW (ref 101–111)
GFR calc non Af Amer: >60 mL/min (ref >60)
Glucose, Bld: 117 mg/dL — ABNORMAL HIGH (ref 65–99)
Potassium: 3.8 mmol/L (ref 3.5–5.1)
SODIUM: 135 mmol/L (ref 135–145)

## 2016-12-22 LAB — CBC
HCT: 39.5 % (ref 35.0–47.0)
Hemoglobin: 13 g/dL (ref 12.0–16.0)
MCH: 31.3 pg (ref 26.0–34.0)
MCHC: 32.9 g/dL (ref 32.0–36.0)
MCV: 95.2 fL (ref 80.0–100.0)
PLATELETS: 267 10*3/uL (ref 150–440)
RBC: 4.15 MIL/uL (ref 3.80–5.20)
RDW: 14.5 % (ref 11.5–14.5)
WBC: 13.7 10*3/uL — ABNORMAL HIGH (ref 3.6–11.0)

## 2016-12-22 LAB — BLOOD GAS, ARTERIAL
ACID-BASE EXCESS: 17.4 mmol/L — AB (ref 0.0–2.0)
Allens test (pass/fail): POSITIVE — AB
Bicarbonate: 47.5 mmol/L — ABNORMAL HIGH (ref 20.0–28.0)
Delivery systems: POSITIVE
Expiratory PAP: 6
FIO2: 60
Inspiratory PAP: 12
O2 SAT: 93.2 %
PCO2 ART: 86 mmHg — AB (ref 32.0–48.0)
PO2 ART: 71 mmHg — AB (ref 83.0–108.0)
Patient temperature: 37
RATE: 12 resp/min
pH, Arterial: 7.35 (ref 7.350–7.450)

## 2016-12-22 MED ORDER — GUAIFENESIN-CODEINE 100-10 MG/5ML PO SOLN
10.0000 mL | ORAL | Status: DC | PRN
  Administered 2016-12-22 – 2016-12-28 (×5): 10 mL via ORAL
  Filled 2016-12-22 (×6): qty 10

## 2016-12-22 MED ORDER — INFLUENZA VAC SPLIT HIGH-DOSE 0.5 ML IM SUSY
0.5000 mL | PREFILLED_SYRINGE | Freq: Once | INTRAMUSCULAR | Status: DC
  Filled 2016-12-22: qty 0.5

## 2016-12-22 NOTE — Progress Notes (Signed)
PULMONARY / CRITICAL CARE MEDICINE   Name: Brooke Trevino MRN: [id number] DOB: 1935/12/06     HISTORY OF PRESENT ILLNESS:   This is a 81 y/o female with a PMH as indicated below, currently undergoing radiation for presumed NSCLC, low EF congestive heart failure and on home O2 3-4L who presented to the ED with complaints of worsening dyspnea x 3 days. Upon EMS' arrival, she was hypoxic in the 70s on 3l [place]. She was placed on BiPAP and transferred to the ED. She is being admitted to the SDU for acute hypoxic respiratory failure and acute on chronic COPD and CHF exacerbation She denies fever, chills, nausea and vomiting but reports a mild cough  REVIEW OF SYSTEMS:  Positives in BOLD  Gen: Denies fever, chills, weight change, fatigue, night sweats HEENT: Denies blurred vision, double vision, hearing loss, tinnitus, sinus congestion, rhinorrhea, sore throat, neck stiffness, dysphagia PULM: mild shortness of breath, cough, sputum production, hemoptysis, wheezing CV: Denies chest pain, edema, orthopnea, paroxysmal nocturnal dyspnea, palpitations GI: Denies abdominal pain, nausea, vomiting, diarrhea, hematochezia, melena, constipation, change in bowel habits GU: Denies dysuria, hematuria, polyuria, oliguria, urethral discharge Endocrine: Denies hot or cold intolerance, polyuria, polyphagia or appetite change Derm: Denies rash, dry skin, scaling or peeling skin change Heme: Denies easy bruising, bleeding, bleeding gums Neuro: Denies headache, numbness, weakness, slurred speech, loss of memory or consciousness  SUBJECTIVE:  No current complaints states shortness of breath improved while on Bipap.  VITAL SIGNS: BP 112/69 (BP Location: Right Arm)   Pulse 74   Temp (!) 96 F (35.6 C) (Axillary)   Resp 18   Ht 5\' 4"  (1.626 m)   Wt 53.5 kg (117 lb 15.1 oz)   SpO2 96%   BMI 20.25 kg/m   HEMODYNAMICS:    VENTILATOR SETTINGS: FiO2 (%):  [40 %-60 %] 50 %  INTAKE / OUTPUT: I/O last 3  completed shifts: In: 360 [P.O.:360] Out: 900 [Urine:900]  PHYSICAL EXAMINATION: General: frail elderly Caucasian female, NAD on Bipap  Neuro:  AAO X3, follows commands, PERRLA HEENT: supple, trachea midline, no JVD Cardiovascular: s1s2, rrr, no M/R/G Lungs: diminished throughout, even, non labored  Abdomen: +BS x4, soft, non tender, non distended  Musculoskeletal: normal bulk and tone, no edema  Skin:  Warm and dry  LABS:  BMET  Recent Labs Lab 12/19/16 1356 12/20/16 0102 12/22/16 0616  NA 132* 136 135  K 4.4 3.5 3.8  CL 85* 87* 86*  CO2 36* 36* 38*  BUN 15 14 31*  CREATININE 0.56 0.51 0.69  GLUCOSE 137* 116* 117*    Electrolytes  Recent Labs Lab 12/19/16 1356 12/20/16 0102 12/22/16 0616  CALCIUM 9.4 9.0 9.4    CBC  Recent Labs Lab 12/19/16 1356 12/22/16 0616  WBC 9.2 13.7*  HGB 13.3 13.0  HCT 39.7 39.5  PLT 264 267    Coag's No results for input(s): APTT, INR in the last 168 hours.  Sepsis Markers  Recent Labs Lab 12/20/16 0840  PROCALCITON <0.10    ABG  Recent Labs Lab 12/22/16 1030  PHART 7.35  PCO2ART 86*  PO2ART 71*    Liver Enzymes No results for input(s): AST, ALT, ALKPHOS, BILITOT, ALBUMIN in the last 168 hours.  Cardiac Enzymes  Recent Labs Lab 12/19/16 1900 12/20/16 0102 12/20/16 0840  TROPONINI <0.03 <0.03 <0.03    Glucose  Recent Labs Lab 12/19/16 1707  GLUCAP 100*    Imaging No results found.  STUDIES:  2 D Echo>>EF 25%-30%  CULTURES: None  ANTIBIOTICS: Levaquin 10/10>>10/10  SIGNIFICANT EVENTS: 10/09>admitted with respiratory failure  LINES/TUBES: PIVs  DISCUSSION: 81 y/o with NSCLC admitted with acute on chronic diastolic and systolic heart failure, Acute on chronic COPD exacerbation and acute hypoxic respiratory failure  ASSESSMENT / PLAN: Acute hypoxic respiratory failure secondary to CHF and COPD exacerbation  Acute CHF exacerbation Acute COPD exacerbation Hypertension-improving   NSCLC-on radiation therapy  Plan Continuous BiPAP and titrate to [place] as tolerated Stat CXR Hemodynamics per ICU protocol Continuous telemetry monitoring  Nebulized bronchodilators IV and inhaled steroids IV diuresis Continue all home medications Prn morphine  Subq lovenox for VTE prophylaxis  Trend CBC Monitor for s/sx of bleeding  She is a Dr. Raul Del pt   Marda Stalker, [place] Pager [phone] (please enter 7 digits) PCCM Consult Pager [phone] (please enter 7 digits)

## 2016-12-22 NOTE — Progress Notes (Signed)
Per verbal orders from B[place], NP pt transition off BiPAP to HFNC at 60 % FiO2 and given lunch. Will continue to assess.

## 2016-12-22 NOTE — Progress Notes (Signed)
[place] at [place] NAME: Brooke Trevino    MR#:  [id number]  DATE OF BIRTH:  11/27/1935  SUBJECTIVE:  CHIEF COMPLAINT:   Chief Complaint  Patient presents with  . Respiratory Distress    Came with severe SOB. Initially required bipap, Tried to put on HFNC, but still intermittently required Bipap.  REVIEW OF SYSTEMS:  CONSTITUTIONAL: No fever, fatigue or weakness.  EYES: No blurred or double vision.  EARS, NOSE, AND THROAT: No tinnitus or ear pain.  RESPIRATORY: No cough,positive for shortness of breath, wheezing , no hemoptysis.  CARDIOVASCULAR: No chest pain, orthopnea, edema.  GASTROINTESTINAL: No nausea, vomiting, diarrhea or abdominal pain.  GENITOURINARY: No dysuria, hematuria.  ENDOCRINE: No polyuria, nocturia,  HEMATOLOGY: No anemia, easy bruising or bleeding SKIN: No rash or lesion. MUSCULOSKELETAL: No joint pain or arthritis.   NEUROLOGIC: No tingling, numbness, weakness.  PSYCHIATRY: No anxiety or depression.   ROS  DRUG ALLERGIES:  No Known Allergies  VITALS:  Blood pressure 128/82, pulse 86, temperature 98 F (36.7 C), temperature source Oral, resp. rate (!) 24, height 5\' 4"  (1.626 m), weight 53.5 kg (117 lb 15.1 oz), SpO2 92 %.  PHYSICAL EXAMINATION:   GENERAL:  81 y.o.-year-old patient lying in the bed with no acute distress. Thin cachectic critically ill EYES: Pupils equal, round, reactive to light and accommodation. No scleral icterus. Extraocular muscles intact.  HEENT: Head atraumatic, normocephalic. Oropharynx and nasopharynx clear.  NECK:  Supple, no jugular venous distention. No thyroid enlargement, no tenderness.  LUNGS:distant breath sounds bilaterally, no wheezing, rales,rhonchi or crepitation. ++ use of accessory muscles of respiration. On Bipap again today. CARDIOVASCULAR: S1, S2 normal. No murmurs, rubs, or gallops.  ABDOMEN: Soft, nontender, nondistended. Bowel sounds present. No organomegaly or  mass.  EXTREMITIES: ++ pedal edema, positive for cyanosis feet NEUROLOGIC: Cranial nerves II through XII are intact. Muscle strength 5/5 in all extremities. Sensation intact. Gait not checked.  PSYCHIATRIC: The patient is alert and oriented x 3.  SKIN: No obvious rash, lesion, or ulcer.   Physical Exam LABORATORY PANEL:   CBC  Recent Labs Lab 12/22/16 0616  WBC 13.7*  HGB 13.0  HCT 39.5  PLT 267   ------------------------------------------------------------------------------------------------------------------  Chemistries   Recent Labs Lab 12/22/16 0616  NA 135  K 3.8  CL 86*  CO2 38*  GLUCOSE 117*  BUN 31*  CREATININE 0.69  CALCIUM 9.4   ------------------------------------------------------------------------------------------------------------------  Cardiac Enzymes  Recent Labs Lab 12/20/16 0102 12/20/16 0840  TROPONINI <0.03 <0.03   ------------------------------------------------------------------------------------------------------------------  RADIOLOGY:  Dg Chest Port 1 View  Result Date: 12/22/2016 CLINICAL DATA:  81 year old female with a history of respiratory failure EXAM: PORTABLE CHEST 1 VIEW COMPARISON:  PET-CT 09/25/2016, chest x-ray 12/19/2016 FINDINGS: Cardiomediastinal silhouette unchanged in size and contour with the left heart borders partially obscured by lung and pleural disease. Masslike opacity of the left upper lobe in the region of a prior cavitary lesion, with linear extension towards the left heart border and hilar region. The right-sided FDG avid nodules on prior PET-CT are not well visualized on the current plain film. Opacity at the left base partially obscuring the left hemidiaphragm and left heart border with blunting of the costophrenic angle. Coarsened interstitial markings on the right. No pneumothorax. IMPRESSION: Recurrent left upper lobe mass in this patient with a prior cavitary lesion at this location is concerning for  recurrent infection/inflammation, or potentially post treatment changes/scar. Further evaluation with contrast-enhanced chest CT recommended. Opacity  at the left lung base may reflect combination of pleural effusion, atelectasis, and/ or treatment changes. Previously identified FDG avid nodules of the right lung are not as well visualized on the current study. Electronically Signed   By: Corrie Mckusick D.O.   On: 12/22/2016 13:24    ASSESSMENT AND PLAN:   Active Problems:   Acute and chronic respiratory failure (acute-on-chronic) ([place])  1. Acute on chronic hypoxic respiratory failure secondary to COPD exacerbation and acute on chronic systolic congestive heart failure  -Continue BiPAP and wean as tolerated- now on HFNC. -IV Lasix  -IV Solu-Medrol , nebulizer, inhalers, Spiriva -Monitor I's and O's -Echo reviewed EF 25%  2. Acute on chronic systolic congestive heart failure -Patient has known EF of 20-25% by echo of January 2017 and now. -Continue lisinopril, Coreg, IV Lasix monitor I's and O's and daily weights  3. Known multiple lung lesions being treated empirically for presumed non-small cell lung cancer -Patient just finished radiation therapy for the right lung at the cancer center -She is being worked up by Dr. Raul Del for questionable cavitary lesion on the left side -Patient completed multiple courses of antibiotics with Ceftin Avelox Levaquin recently over the last severalWeeks. -Patient does not seem to have any bacterial infection at present , no need for  antibiotics  4. Known history of AAA 3.5 cm per last ultrasound abdomen -Patient follows with vascular surgery Dr. Lucky Cowboy   5. Known cardiac myopathy Continue Coreg and Lasix lisinopril -Consider adding Spironolactone  6. DVT prophylaxis subcutaneous Lovenox  Prognosis very poor, called palliative care consult.  All the records are reviewed and case discussed with Care Management/Social Workerr. Management plans  discussed with the patient, family and they are in agreement.  CODE STATUS: full.  TOTAL TIME TAKING CARE OF THIS PATIENT: 35 minutes.   POSSIBLE D/C IN 1-2 DAYS, DEPENDING ON CLINICAL CONDITION.   Vaughan Basta M.D on 12/22/2016   Between 7am to 6pm - Pager - [phone]  After 6pm go to [email or web address] - password [email or web address] [facility]  Office  [phone]  CC: Primary care physician; Sofie Hartigan, MD  Note: This dictation was prepared with Dragon dictation along with smaller phrase technology. Any transcriptional errors that result from this process are unintentional.

## 2016-12-22 NOTE — Progress Notes (Signed)
Palliative Medicine Team  Due to high volume of referrals, there is a delay seeing this patient. PMT not at [facility] over the weekend but will arrange goals of care with patient and family on Monday. Thank you for the opportunity to participate in the care of Ms. Brooke Trevino.   Ihor Dow, FNP-C Palliative Medicine Team  Phone: [phone] Fax: [phone]

## 2016-12-22 NOTE — Progress Notes (Signed)
PT Cancellation Note  Patient Details Name: Brooke Trevino MRN: [id number] DOB: 1935/08/03   Cancelled Treatment:    Reason Eval/Treat Not Completed: Patient declined, no reason specified; Pt on BiPAP; nursing notified PT pt would be able to sit at EOB for participation within session, but pt declined. Per policy, d/c consult per refusal/lack of ability to participate x3 days. Please re-consult as medically appropriate.   Wetzel Bjornstad, SPT 12/22/2016, 1:29 PM

## 2016-12-22 NOTE — Progress Notes (Signed)
Upon entering the room this nurse observed the patient anxious in tripod position with audible wheezes. respirtory therapist ata bedside. Pt requesting BiPAP. Pt placed back on BiPAP at 60%. Will continue to assess.

## 2016-12-22 NOTE — Progress Notes (Signed)
Pt's husband upset that patient is not receiving breakfast due to being placed on the BiPAP. Pt's husband provided education on importance of client remaining on the BiPAP, and the reasons that she cannot eat while on the BiPAP. Education provided on risk of aspiration while on BiPAP. Will continue to assess.

## 2016-12-23 LAB — BASIC METABOLIC PANEL
ANION GAP: 7 (ref 5–15)
BUN: 31 mg/dL — ABNORMAL HIGH (ref 6–20)
CALCIUM: 9.1 mg/dL (ref 8.9–10.3)
CO2: 39 mmol/L — ABNORMAL HIGH (ref 22–32)
Chloride: 88 mmol/L — ABNORMAL LOW (ref 101–111)
Creatinine, Ser: 0.61 mg/dL (ref 0.44–1.00)
GFR calc Af Amer: >60 mL/min (ref >60)
Glucose, Bld: 129 mg/dL — ABNORMAL HIGH (ref 65–99)
POTASSIUM: 4.1 mmol/L (ref 3.5–5.1)
SODIUM: 134 mmol/L — AB (ref 135–145)

## 2016-12-23 LAB — PROCALCITONIN

## 2016-12-23 LAB — CBC
HEMATOCRIT: 38 % (ref 35.0–47.0)
HEMOGLOBIN: 12.7 g/dL (ref 12.0–16.0)
MCH: 31.4 pg (ref 26.0–34.0)
MCHC: 33.4 g/dL (ref 32.0–36.0)
MCV: 94.1 fL (ref 80.0–100.0)
PLATELETS: 234 10*3/uL (ref 150–440)
RBC: 4.04 MIL/uL (ref 3.80–5.20)
RDW: 14.4 % (ref 11.5–14.5)
WBC: 12.1 10*3/uL — ABNORMAL HIGH (ref 3.6–11.0)

## 2016-12-23 LAB — PHOSPHORUS: Phosphorus: 3.6 mg/dL (ref 2.5–4.6)

## 2016-12-23 LAB — MAGNESIUM: MAGNESIUM: 2.4 mg/dL (ref 1.7–2.4)

## 2016-12-23 NOTE — Progress Notes (Signed)
Pt sitting up in bed trying to eat breaikfast noted to be in resp distress.  Sats low 80's. Switched back over to bipap from high flow.  R.T made aware. Adjusted O2 sat to 70% via bipap.

## 2016-12-23 NOTE — Progress Notes (Signed)
[facility] [facility] [place] NAME: Brooke Trevino    MR#:  [id number]  DATE OF BIRTH:  09/03/1935  SUBJECTIVE:  CHIEF COMPLAINT:   Chief Complaint  Patient presents with  . Respiratory Distress   Between HFNC and Bipap. Placed back on Bipap today AM  REVIEW OF SYSTEMS:  CONSTITUTIONAL: No fever, fatigue or weakness.  EYES: No blurred or double vision.  EARS, NOSE, AND THROAT: No tinnitus or ear pain.  RESPIRATORY: No cough,positive for shortness of breath, wheezing , no hemoptysis.  CARDIOVASCULAR: No chest pain, orthopnea, edema.  GASTROINTESTINAL: No nausea, vomiting, diarrhea or abdominal pain.  GENITOURINARY: No dysuria, hematuria.  ENDOCRINE: No polyuria, nocturia,  HEMATOLOGY: No anemia, easy bruising or bleeding SKIN: No rash or lesion. MUSCULOSKELETAL: No joint pain or arthritis.   NEUROLOGIC: No tingling, numbness, weakness.  PSYCHIATRY: No anxiety or depression.   ROS  DRUG ALLERGIES:  No Known Allergies  VITALS:  Blood pressure (!) 143/80, pulse 69, temperature 97.6 F (36.4 C), resp. rate 19, height 5\' 4"  (1.626 m), weight 53.5 kg (117 lb 15.1 oz), SpO2 94 %.  PHYSICAL EXAMINATION:   GENERAL:  81 y.o.-year-old patient lying in the bed with resp distress. Thin cachectic critically ill EYES: Pupils equal, round, reactive to light and accommodation. No scleral icterus. Extraocular muscles intact.  HEENT: Head atraumatic, normocephalic. Oropharynx and nasopharynx clear.  NECK:  Supple, no jugular venous distention. No thyroid enlargement, no tenderness.  LUNGS:distant breath sounds bilaterally, no wheezing, rales,rhonchi or crepitation. ++ use of accessory muscles of respiration. On Bipap again today. CARDIOVASCULAR: S1, S2 normal. No murmurs, rubs, or gallops.  ABDOMEN: Soft, nontender, nondistended. Bowel sounds present. No organomegaly or mass.  EXTREMITIES: LE edema NEUROLOGIC: Cranial nerves II through XII are intact.  Muscle strength 5/5 in all extremities. Sensation intact. Gait not checked.  PSYCHIATRIC: The patient is alert and awake SKIN: No obvious rash, lesion, or ulcer.   Physical Exam LABORATORY PANEL:   CBC  Recent Labs Lab 12/23/16 0254  WBC 12.1*  HGB 12.7  HCT 38.0  PLT 234   ------------------------------------------------------------------------------------------------------------------  Chemistries   Recent Labs Lab 12/23/16 0254  NA 134*  K 4.1  CL 88*  CO2 39*  GLUCOSE 129*  BUN 31*  CREATININE 0.61  CALCIUM 9.1  MG 2.4   ------------------------------------------------------------------------------------------------------------------  Cardiac Enzymes  Recent Labs Lab 12/20/16 0102 12/20/16 0840  TROPONINI <0.03 <0.03   ------------------------------------------------------------------------------------------------------------------  RADIOLOGY:  Dg Chest Port 1 View  Result Date: 12/22/2016 CLINICAL DATA:  81 year old female with a history of respiratory failure EXAM: PORTABLE CHEST 1 VIEW COMPARISON:  PET-CT 09/25/2016, chest x-ray 12/19/2016 FINDINGS: Cardiomediastinal silhouette unchanged in size and contour with the left heart borders partially obscured by lung and pleural disease. Masslike opacity of the left upper lobe in the region of a prior cavitary lesion, with linear extension towards the left heart border and hilar region. The right-sided FDG avid nodules on prior PET-CT are not well visualized on the current plain film. Opacity at the left base partially obscuring the left hemidiaphragm and left heart border with blunting of the costophrenic angle. Coarsened interstitial markings on the right. No pneumothorax. IMPRESSION: Recurrent left upper lobe mass in this patient with a prior cavitary lesion at this location is concerning for recurrent infection/inflammation, or potentially post treatment changes/scar. Further evaluation with contrast-enhanced  chest CT recommended. Opacity at the left lung base may reflect combination of pleural effusion, atelectasis, and/ or treatment changes. Previously  identified FDG avid nodules of the right lung are not as well visualized on the current study. Electronically Signed   By: Corrie Mckusick D.O.   On: 12/22/2016 13:24    ASSESSMENT AND PLAN:   Active Problems:   Acute and chronic respiratory failure (acute-on-chronic) (HCC)  # Acute on chronic hypoxic respiratory failure secondary to COPD exacerbation and acute on chronic systolic congestive heart failure  -Continue BiPAP and wean as tolerated -IV Lasix stopped -IV Solu-Medrol , nebulizer, inhalers, Spiriva -Monitor I's and O's -Echo reviewed EF 25%  # Acute on chronic systolic congestive heart failure -Patient has known EF of 20-25% by echo of January 2017 and now. -Continue lisinopril, Coreg IV lasix stopped No pulm edema on CXR  # Known multiple lung lesions being treated empirically for presumed non-small cell lung cancer -Patient just finished radiation therapy for the right lung at the cancer center -She is being worked up by Dr. Raul Del for questionable cavitary lesion on the left side -Patient completed multiple courses of antibiotics with Ceftin Avelox Levaquin recently over the last severalWeeks. -Patient does not seem to have any bacterial infection at present , no need for  antibiotics  # Known history of AAA 3.5 cm per last ultrasound abdomen -Patient follows with vascular surgery Dr. Lucky Cowboy  # DVT prophylaxis subcutaneous Lovenox  All the records are reviewed and case discussed with Care Management/Social Workerr. Management plans discussed with the patient, family and they are in agreement.  CODE STATUS: full.  TOTAL TIME TAKING CARE OF THIS PATIENT: 30 minutes.   POSSIBLE D/C IN 1-2 DAYS, DEPENDING ON CLINICAL CONDITION.   Hillary Bow R M.D on 12/23/2016   Between 7am to 6pm - Pager - [phone]  After  6pm go to [email or web address] - password [email or web address] [facility]  Office  [phone]  CC: Primary care physician; Sofie Hartigan, MD  Note: This dictation was prepared with Dragon dictation along with smaller phrase technology. Any transcriptional errors that result from this process are unintentional.

## 2016-12-24 MED ORDER — IPRATROPIUM-ALBUTEROL 0.5-2.5 (3) MG/3ML IN SOLN
3.0000 mL | Freq: Once | RESPIRATORY_TRACT | Status: AC
  Administered 2016-12-24: 3 mL via RESPIRATORY_TRACT
  Filled 2016-12-24: qty 3

## 2016-12-24 MED ORDER — METHYLPREDNISOLONE SODIUM SUCC 125 MG IJ SOLR
60.0000 mg | Freq: Three times a day (TID) | INTRAMUSCULAR | Status: DC
  Administered 2016-12-24 – 2016-12-25 (×2): 60 mg via INTRAVENOUS
  Filled 2016-12-24 (×2): qty 2

## 2016-12-24 MED ORDER — MORPHINE SULFATE (PF) 2 MG/ML IV SOLN: 2.0000 mg | INTRAVENOUS | Status: DC | PRN

## 2016-12-24 NOTE — Progress Notes (Signed)
[facility] [place] NAME: Brooke Trevino    MR#:  [id number]  DATE OF BIRTH:  [birth year]  SUBJECTIVE:  CHIEF COMPLAINT:   Chief Complaint  Patient presents with  . Respiratory Distress   On Bipap and awake  REVIEW OF SYSTEMS:  CONSTITUTIONAL: No fever, fatigue or weakness.  EYES: No blurred or double vision.  EARS, NOSE, AND THROAT: No tinnitus or ear pain.  RESPIRATORY: No cough,positive for shortness of breath, wheezing , no hemoptysis.  CARDIOVASCULAR: No chest pain, orthopnea, edema.  GASTROINTESTINAL: No nausea, vomiting, diarrhea or abdominal pain.  GENITOURINARY: No dysuria, hematuria.  ENDOCRINE: No polyuria, nocturia,  HEMATOLOGY: No anemia, easy bruising or bleeding SKIN: No rash or lesion. MUSCULOSKELETAL: No joint pain or arthritis.   NEUROLOGIC: No tingling, numbness, weakness.  PSYCHIATRY: No anxiety or depression.   ROS  DRUG ALLERGIES:  No Known Allergies  VITALS:  Blood pressure 125/76, pulse 79, temperature 98.3 F (36.8 C), resp. rate 19, height 5\' 4"  (1.626 m), weight 53.8 kg (118 lb 9.7 oz), SpO2 98 %.  PHYSICAL EXAMINATION:   GENERAL:  81 y.o.-year-old patient lying in the bed with resp distress. Thin cachectic critically ill EYES: Pupils equal, round, reactive to light and accommodation. No scleral icterus. Extraocular muscles intact.  HEENT: Head atraumatic, normocephalic. Oropharynx and nasopharynx clear.  NECK:  Supple, no jugular venous distention. No thyroid enlargement, no tenderness.  LUNGS:distant breath sounds bilaterally, no wheezing, rales,rhonchi or crepitation. ++ use of accessory muscles of respiration. On Bipap again today. CARDIOVASCULAR: S1, S2 normal. No murmurs, rubs, or gallops.  ABDOMEN: Soft, nontender, nondistended. Bowel sounds present. No organomegaly or mass.  EXTREMITIES: LE edema NEUROLOGIC: Cranial nerves II through XII are intact. Muscle strength 5/5 in all extremities.  Sensation intact. Gait not checked.  PSYCHIATRIC: The patient is alert and awake SKIN: No obvious rash, lesion, or ulcer.   Physical Exam LABORATORY PANEL:   CBC  Recent Labs Lab 12/23/16 0254  WBC 12.1*  HGB 12.7  HCT 38.0  PLT 234   ------------------------------------------------------------------------------------------------------------------  Chemistries   Recent Labs Lab 12/23/16 0254  NA 134*  K 4.1  CL 88*  CO2 39*  GLUCOSE 129*  BUN 31*  CREATININE 0.61  CALCIUM 9.1  MG 2.4   ------------------------------------------------------------------------------------------------------------------  Cardiac Enzymes  Recent Labs Lab 12/20/16 0102 12/20/16 0840  TROPONINI <0.03 <0.03   ------------------------------------------------------------------------------------------------------------------  RADIOLOGY:  Dg Chest Port 1 View  Result Date: 12/22/2016 CLINICAL DATA:  81 year old female with a history of respiratory failure EXAM: PORTABLE CHEST 1 VIEW COMPARISON:  PET-CT 09/25/2016, chest x-ray 12/19/2016 FINDINGS: Cardiomediastinal silhouette unchanged in size and contour with the left heart borders partially obscured by lung and pleural disease. Masslike opacity of the left upper lobe in the region of a prior cavitary lesion, with linear extension towards the left heart border and hilar region. The right-sided FDG avid nodules on prior PET-CT are not well visualized on the current plain film. Opacity at the left base partially obscuring the left hemidiaphragm and left heart border with blunting of the costophrenic angle. Coarsened interstitial markings on the right. No pneumothorax. IMPRESSION: Recurrent left upper lobe mass in this patient with a prior cavitary lesion at this location is concerning for recurrent infection/inflammation, or potentially post treatment changes/scar. Further evaluation with contrast-enhanced chest CT recommended. Opacity at the left  lung base may reflect combination of pleural effusion, atelectasis, and/ or treatment changes. Previously identified FDG avid nodules of the right  lung are not as well visualized on the current study. Electronically Signed   By: Corrie Mckusick D.O.   On: 12/22/2016 13:24    ASSESSMENT AND PLAN:   Active Problems:   Acute and chronic respiratory failure (acute-on-chronic) (HCC)  # Acute on chronic hypoxic respiratory failure secondary to COPD exacerbation and acute on chronic systolic congestive heart failure  # Acute COPD exacerbation Continue Bipap. Wean as tolerated -IV steroids - Scheduled Nebulizers - Inhalers -Wean O2 as tolerated - Appreciate pulmonary input   # Acute on chronic systolic congestive heart failure -Patient has known EF of 20-25% by echo of January 2017 and now. -Continue lisinopril, Coreg IV lasix stopped No pulm edema on CXR  # Known multiple lung lesions being treated empirically for presumed non-small cell lung cancer -Patient just finished radiation therapy for the right lung at the cancer center -She is being worked up by Dr. Raul Del for questionable cavitary lesion on the left side -Patient completed multiple courses of antibiotics with Ceftin Avelox Levaquin recently over the last severalWeeks. -Patient does not seem to have any bacterial infection at present , no need for  antibiotics  # Known history of AAA 3.5 cm per last ultrasound abdomen -Patient follows with vascular surgery Dr. Lucky Cowboy  # DVT prophylaxis subcutaneous Lovenox  All the records are reviewed and case discussed with Care Management/Social Workerr. Management plans discussed with the patient, family and they are in agreement.  CODE STATUS: full.  TOTAL TIME TAKING CARE OF THIS PATIENT: 30 minutes.   POSSIBLE D/C IN 1-2 DAYS, DEPENDING ON CLINICAL CONDITION.   Hillary Bow R M.D on 12/24/2016   Between 7am to 6pm - Pager - [phone]  After 6pm go to [email or web address] -  password [email or web address] [facility]  Office  [phone]  CC: Primary care physician; Sofie Hartigan, MD  Note: This dictation was prepared with Dragon dictation along with smaller phrase technology. Any transcriptional errors that result from this process are unintentional.

## 2016-12-24 NOTE — Progress Notes (Signed)
PULMONARY / CRITICAL CARE MEDICINE   Name: Brooke Trevino MRN: [id number] DOB: [birth year]      SUBJECTIVE:  Patient seen and examined at bedside. No acute events overnight.  VITAL SIGNS: BP 128/76 (BP Location: Right Arm)   Pulse 77   Temp 98 F (36.7 C) (Oral)   Resp 19   Ht 5\' 4"  (1.626 m)   Wt 118 lb 9.7 oz (53.8 kg)   SpO2 99%   BMI 20.36 kg/m   HEMODYNAMICS:    VENTILATOR SETTINGS: FiO2 (%):  [62 %-70 %] 65 %  INTAKE / OUTPUT: I/O last 3 completed shifts: In: 360 [P.O.:360] Out: 1375 [Urine:1375]  PHYSICAL EXAMINATION: General: frail elderly Caucasian female, NAD on Bipap  Neuro:  AAO X3, follows commands, PERRLA HEENT: supple, trachea midline, no JVD Cardiovascular: s1s2, rrr, no M/R/G Lungs: diminished throughout, even, non labored  Abdomen: +BS x4, soft, non tender, non distended  Musculoskeletal: normal bulk and tone, no edema  Skin:  Warm and dry  LABS:  BMET  Recent Labs Lab 12/20/16 0102 12/22/16 0616 12/23/16 0254  NA 136 135 134*  K 3.5 3.8 4.1  CL 87* 86* 88*  CO2 36* 38* 39*  BUN 14 31* 31*  CREATININE 0.51 0.69 0.61  GLUCOSE 116* 117* 129*    Electrolytes  Recent Labs Lab 12/20/16 0102 12/22/16 0616 12/23/16 0254  CALCIUM 9.0 9.4 9.1  MG  --   --  2.4  PHOS  --   --  3.6    CBC  Recent Labs Lab 12/19/16 1356 12/22/16 0616 12/23/16 0254  WBC 9.2 13.7* 12.1*  HGB 13.3 13.0 12.7  HCT 39.7 39.5 38.0  PLT 264 267 234    Coag's No results for input(s): APTT, INR in the last 168 hours.  Sepsis Markers  Recent Labs Lab 12/20/16 0840 12/23/16 0254  PROCALCITON <0.10 <0.10    ABG  Recent Labs Lab 12/22/16 1030  PHART 7.35  PCO2ART 86*  PO2ART 71*    Liver Enzymes No results for input(s): AST, ALT, ALKPHOS, BILITOT, ALBUMIN in the last 168 hours.  Cardiac Enzymes  Recent Labs Lab 12/19/16 1900 12/20/16 0102 12/20/16 0840  TROPONINI <0.03 <0.03 <0.03    Glucose  Recent Labs Lab  12/19/16 1707  GLUCAP 100*    Imaging No results found.  STUDIES:  2 D Echo>>EF 25%-30%  CULTURES: None  ANTIBIOTICS: Levaquin 10/10>>10/10  SIGNIFICANT EVENTS: 10/09>admitted with respiratory failure  LINES/TUBES: PIVs    ASSESSMENT / PLAN: Acute hypoxic respiratory failure secondary to CHF and COPD exacerbation  Hypertension-improving  NSCLC-on radiation therapy  Plan Wean off BIPAP as tolerated. On steroids and BD.  Monitor uop and creatinine closely. Off diuretics. Management of other issues as per 1 team.  Dimas Chyle MD PCCM

## 2016-12-25 ENCOUNTER — Inpatient Hospital Stay: Payer: Medicare Other

## 2016-12-25 DIAGNOSIS — I5020 Unspecified systolic (congestive) heart failure: Secondary | ICD-10-CM

## 2016-12-25 DIAGNOSIS — I420 Dilated cardiomyopathy: Secondary | ICD-10-CM

## 2016-12-25 DIAGNOSIS — J9620 Acute and chronic respiratory failure, unspecified whether with hypoxia or hypercapnia: Secondary | ICD-10-CM

## 2016-12-25 DIAGNOSIS — J9622 Acute and chronic respiratory failure with hypercapnia: Secondary | ICD-10-CM

## 2016-12-25 DIAGNOSIS — Z515 Encounter for palliative care: Secondary | ICD-10-CM

## 2016-12-25 DIAGNOSIS — I5022 Chronic systolic (congestive) heart failure: Secondary | ICD-10-CM

## 2016-12-25 DIAGNOSIS — J449 Chronic obstructive pulmonary disease, unspecified: Secondary | ICD-10-CM

## 2016-12-25 LAB — GLUCOSE, CAPILLARY: GLUCOSE-CAPILLARY: 125 mg/dL — AB (ref 65–99)

## 2016-12-25 MED ORDER — IPRATROPIUM-ALBUTEROL 0.5-2.5 (3) MG/3ML IN SOLN
3.0000 mL | Freq: Four times a day (QID) | RESPIRATORY_TRACT | Status: DC
  Administered 2016-12-25 – 2016-12-31 (×24): 3 mL via RESPIRATORY_TRACT
  Filled 2016-12-25 (×24): qty 3

## 2016-12-25 MED ORDER — MORPHINE SULFATE (CONCENTRATE) 10 MG/0.5ML PO SOLN: 5.0000 mg | ORAL | Status: DC | PRN

## 2016-12-25 MED ORDER — ALBUTEROL SULFATE (2.5 MG/3ML) 0.083% IN NEBU: 2.5000 mg | INHALATION_SOLUTION | RESPIRATORY_TRACT | Status: DC | PRN

## 2016-12-25 MED ORDER — FUROSEMIDE 10 MG/ML IJ SOLN
40.0000 mg | Freq: Once | INTRAMUSCULAR | Status: AC
  Administered 2016-12-25: 40 mg via INTRAVENOUS
  Filled 2016-12-25: qty 4

## 2016-12-25 MED ORDER — FAMOTIDINE 20 MG PO TABS
20.0000 mg | ORAL_TABLET | Freq: Every day | ORAL | Status: DC
  Administered 2016-12-25 – 2016-12-26 (×2): 20 mg via ORAL
  Filled 2016-12-25 (×2): qty 1

## 2016-12-25 MED ORDER — FAMOTIDINE IN NACL 20-0.9 MG/50ML-% IV SOLN: 20.0000 mg | INTRAVENOUS | Status: DC

## 2016-12-25 MED ORDER — METHYLPREDNISOLONE SODIUM SUCC 40 MG IJ SOLR
40.0000 mg | Freq: Two times a day (BID) | INTRAMUSCULAR | Status: DC
  Administered 2016-12-25 – 2016-12-27 (×4): 40 mg via INTRAVENOUS
  Filled 2016-12-25 (×4): qty 1

## 2016-12-25 NOTE — Progress Notes (Signed)
Cycling back and forth between HFNC and BiPAP. Presently comfortable on HF Dean. At baseline, she wears 4-5 LPM  at home. No new complaints. No distress  Vitals:   12/25/16 1300 12/25/16 1350 12/25/16 1500 12/25/16 1600  BP: 134/77  130/83 124/68  Pulse: 88  81 81  Resp: (!) 28  (!) 26 (!) 23  Temp:   97.6 F (36.4 C)   TempSrc:   Other (Comment)   SpO2: 98% 97% 97% 100%  Weight:      Height:       Frail-appearing, NAD Rattling cough HEENT WNL No JVD Markedly diminished breath sounds throughout, few scattered wheezes Regular, no M NABS, soft No LE edema  BMP Latest Ref Rng & Units 12/23/2016 12/22/2016 12/20/2016  Glucose 65 - 99 mg/dL 129(H) 117(H) 116(H)  BUN 6 - 20 mg/dL 31(H) 31(H) 14  Creatinine 0.44 - 1.00 mg/dL 0.61 0.69 0.51  Sodium 135 - 145 mmol/L 134(L) 135 136  Potassium 3.5 - 5.1 mmol/L 4.1 3.8 3.5  Chloride 101 - 111 mmol/L 88(L) 86(L) 87(L)  CO2 22 - 32 mmol/L 39(H) 38(H) 36(H)  Calcium 8.9 - 10.3 mg/dL 9.1 9.4 9.0   CBC Latest Ref Rng & Units 12/23/2016 12/22/2016 12/19/2016  WBC 3.6 - 11.0 K/uL 12.1(H) 13.7(H) 9.2  Hemoglobin 12.0 - 16.0 g/dL 12.7 13.0 13.3  Hematocrit 35.0 - 47.0 % 38.0 39.5 39.7  Platelets 150 - 440 K/uL 234 267 264   CXR: New small right pleural effusion with right basilar atelectasis or pneumonia. Improved appearance of the parenchymal consolidation in the left upper lobe. New subtle density in the left mid lung. Stable cardiomegaly without pulmonary vascular congestion  IMPRESSION: Acute on chronic hypoxic/hypercarbic respiratory failure Severe cardiomyopathy ( LVEF 25-30% by echocardiogram 12/20/16) End-stage COPD with acute exacerbation Right pleural effusion - likely due to CHF  PLAN/REC: Continue supplemental oxygen to maintain SPO2 greater than 86% Continue BiPAP as needed Continue nebulized steroids and bronchodilators Continue systemic steroids - dose adjusted Furosemide 1 dose 10/15. Repeat CXR 10/16 Mucus clearance  techniques - Bed percussion and flutter valve  I discussed goals of care at length with patient and her family. She has executed a living will in the past. She and they desire DNR/DNI. I have placed order for this.  After discharge, I think she would be greatly benefited from a chest percussion vest and also from hospice involvement in her care.  I have requested palliative care consultation to help make this transition  Merton Border, MD PCCM service Mobile [phone] Pager [phone] 12/25/2016 4:50 PM

## 2016-12-25 NOTE — Consult Note (Signed)
Consultation Note Date: 12/25/2016   Patient Name: Brooke Trevino  DOB: 11/21/1935  MRN: [id number]  Age / Sex: 81 y.o., female  PCP: Sofie Hartigan, MD Referring Physician: Hillary Bow, MD  Reason for Consultation: Establishing goals of care and Hospice Evaluation  HPI/Patient Profile: 81 y.o. female  with past medical history of COPD and CHF (ef 20-25%) who was admitted on 12/19/2016 with acute on chronic respiratory failure secondary to COPD exacerbation. She remains in ICU on high flow nasal cannula (currently at 35L 45%).  Clinical Assessment and Goals of Care: I have reviewed medical records including EPIC notes, labs and imaging, received report from the care team, assessed the patient and then met with the patient's daughter and grand dtr  to discuss diagnosis prognosis, GOC, EOL wishes, disposition and options.  I introduced Palliative Medicine as specialized medical care for people living with serious illness. It focuses on providing relief from the symptoms and stress of a serious illness. The goal is to improve quality of life for both the patient and the family.  We discussed a brief life review of the patient. She 1st married at 55.  She had her daughter at 61 and was divorced after 17 months of marriage.  She and her daughter grew up together.  She worked as a Network engineer.  She smoked 2 packs a day for 55 years and developed breathing problems 12 years ago when she stopped smoking.  As far as functional and nutritional status - she is able to get from be to chair on her walker.  She was eating fairly well until the week before admission when she stopped eating.  A larger family meeting is planned for 10/16, but at this point the patient's grand daughter believes she would be amenable to hospice helping her at home.  Going to doctors appointments is exhausting to the patient.  Questions  and concerns were addressed.  The family was encouraged to call with questions or concerns.    Primary Decision Maker:  PATIENT    SUMMARY OF RECOMMENDATIONS     PMT family meeting 10/16 to discuss [place].  Will add morphine SL for dyspnea PRN (as it will last longer than IV).   Code Status/Advance Care Planning:  DNR   Psycho-social/Spiritual:   Desire for further Chaplaincy support: yes  Prognosis:   Difficult to determine.  Very advanced COPD and end stage CHF.  She will need to be off of high flow in order to D/c from the hospital.   Discharge Planning: To Be Determined      Primary Diagnoses: Present on Admission: . Acute and chronic respiratory failure (acute-on-chronic) ([place])   I have reviewed the medical record, interviewed the patient and family, and examined the patient. The following aspects are pertinent.  Past Medical History:  Diagnosis Date  . Emphysema lung ([place])   . History of COPD    Social History   Social History  . Marital status: Married    Spouse name: N/A  . Number  of children: N/A  . Years of education: N/A   Social History Main Topics  . Smoking status: Former Smoker    Quit date: 08/08/2005  . Smokeless tobacco: Never Used  . Alcohol use No  . Drug use: Unknown  . Sexual activity: Not Asked   Other Topics Concern  . None   Social History Narrative  . None   Family History  Problem Relation Age of Onset  . Breast cancer Neg Hx    Scheduled Meds: . aspirin  81 mg Oral QHS  . budesonide (PULMICORT) nebulizer solution  0.5 mg Nebulization BID  . carvedilol  6.25 mg Oral BID WC  . chlorhexidine  15 mL Mouth Rinse BID  . citalopram  10 mg Oral Daily  . enoxaparin (LOVENOX) injection  40 mg Subcutaneous Q24H  . famotidine  20 mg Oral Daily  . furosemide  40 mg Intravenous Once  . Influenza vac split quadrivalent PF  0.5 mL Intramuscular Once  . ipratropium-albuterol  3 mL Nebulization Q6H  . lisinopril  10 mg Oral Daily    . mouth rinse  15 mL Mouth Rinse q12n4p  . meloxicam  7.5 mg Oral Daily  . methylPREDNISolone (SOLU-MEDROL) injection  40 mg Intravenous Q12H  . montelukast  10 mg Oral QHS  . potassium chloride SA  20 mEq Oral Daily  . pravastatin  20 mg Oral QHS  . senna  1 tablet Oral BID   Continuous Infusions: PRN Meds:.acetaminophen **OR** acetaminophen, albuterol, calcium carbonate, guaiFENesin-codeine, morphine injection, [DISCONTINUED] ondansetron **OR** ondansetron (ZOFRAN) IV, polyethylene glycol, temazepam No Known Allergies Review of Systems no complaints of pain.  Complains of increased DOE and fatigue  Physical Exam  Thin very pleasant female, looks younger than stated age.  A&O, coherent, cooperative CV rrr resp on high flow n/c Abdomen soft, nt, nd  Vital Signs: BP (!) 146/78   Pulse 80   Temp (!) 96.7 F (35.9 C) (Axillary)   Resp (!) 23   Ht 5' 4"  (1.626 m)   Wt 54.5 kg (120 lb 2.4 oz)   SpO2 92%   BMI 20.62 kg/m  Pain Assessment: No/denies pain   Pain Score: Asleep   SpO2: SpO2: 92 % O2 Device:SpO2: 92 % O2 Flow Rate: .O2 Flow Rate (L/min): 35 L/min  IO: Intake/output summary:  Intake/Output Summary (Last 24 hours) at 12/25/16 1143 Last data filed at 12/25/16 0600  Gross per 24 hour  Intake              360 ml  Output              975 ml  Net             -615 ml    LBM: Last BM Date: 12/24/16 Baseline Weight: Weight: 52.6 kg (115 lb 15.4 oz) Most recent weight: Weight: 54.5 kg (120 lb 2.4 oz)     Palliative Assessment/Data: 30%     Time In: 12:00 Time Out: 1:10 Time Total: 70 min. Greater than 50%  of this time was spent counseling and coordinating care related to the above assessment and plan.  Signed by: Florentina Jenny, PA-C Palliative Medicine Pager: [phone]  Please contact Palliative Medicine Team phone at [phone] for questions and concerns.  For individual provider: See Shea Evans

## 2016-12-25 NOTE — Progress Notes (Signed)
This RN took over care of patient at 3pm.  Patient alert and oriented, vital signs stable, sinus rhythm on cardiac monitor.   Patient currently on HFNC, tolerating well, did not eat much from dinner tray but patient states that "i've eaten more today than I have the past few days I've been here"  Patient also states that she is feeling better.   Adequate urine output. Patient currently resting in no apparent distress.  See CHL for further details.

## 2016-12-25 NOTE — Progress Notes (Signed)
Decreased to 45%.

## 2016-12-25 NOTE — Progress Notes (Signed)
Patient rested well during shift.  One episode of anxiety and SOB.  PRN medication given with good effect.  On BiPap this shift.  No acute distress noted at this time.  Will monitor.

## 2016-12-25 NOTE — Progress Notes (Signed)
[facility] [place] NAME: Brooke Trevino    MR#:  [id number]  DATE OF BIRTH:  04/17/1935  SUBJECTIVE:  CHIEF COMPLAINT:   Chief Complaint  Patient presents with  . Respiratory Distress   On Bipap and awake. NO pain  REVIEW OF SYSTEMS:  CONSTITUTIONAL: No fever, fatigue or weakness.  EYES: No blurred or double vision.  EARS, NOSE, AND THROAT: No tinnitus or ear pain.  RESPIRATORY: No cough,positive for shortness of breath, wheezing , no hemoptysis.  CARDIOVASCULAR: No chest pain, orthopnea, edema.  GASTROINTESTINAL: No nausea, vomiting, diarrhea or abdominal pain.  GENITOURINARY: No dysuria, hematuria.  ENDOCRINE: No polyuria, nocturia,  HEMATOLOGY: No anemia, easy bruising or bleeding SKIN: No rash or lesion. MUSCULOSKELETAL: No joint pain or arthritis.   NEUROLOGIC: No tingling, numbness, weakness.  PSYCHIATRY: No anxiety or depression.   ROS  DRUG ALLERGIES:  No Known Allergies  VITALS:  Blood pressure 134/77, pulse 88, temperature (!) 96.4 F (35.8 C), temperature source Axillary, resp. rate (!) 28, height 5\' 4"  (1.626 m), weight 54.5 kg (120 lb 2.4 oz), SpO2 98 %.  PHYSICAL EXAMINATION:   GENERAL:  81 y.o.-year-old patient lying in the bed with resp distress. Thin cachectic critically ill EYES: Pupils equal, round, reactive to light and accommodation. No scleral icterus. Extraocular muscles intact.  HEENT: Head atraumatic, normocephalic. Oropharynx and nasopharynx clear.  NECK:  Supple, no jugular venous distention. No thyroid enlargement, no tenderness.  LUNGS:distant breath sounds bilaterally, no wheezing, rales,rhonchi or crepitation. ++ use of accessory muscles of respiration. On Bipap again today. CARDIOVASCULAR: S1, S2 normal. No murmurs, rubs, or gallops.  ABDOMEN: Soft, nontender, nondistended. Bowel sounds present. No organomegaly or mass.  EXTREMITIES: LE edema NEUROLOGIC: Cranial nerves II through XII are  intact. Muscle strength 5/5 in all extremities. Sensation intact. Gait not checked.  PSYCHIATRIC: The patient is alert and awake SKIN: No obvious rash, lesion, or ulcer.   Physical Exam LABORATORY PANEL:   CBC  Recent Labs Lab 12/23/16 0254  WBC 12.1*  HGB 12.7  HCT 38.0  PLT 234   ------------------------------------------------------------------------------------------------------------------  Chemistries   Recent Labs Lab 12/23/16 0254  NA 134*  K 4.1  CL 88*  CO2 39*  GLUCOSE 129*  BUN 31*  CREATININE 0.61  CALCIUM 9.1  MG 2.4   ------------------------------------------------------------------------------------------------------------------  Cardiac Enzymes  Recent Labs Lab 12/20/16 0102 12/20/16 0840  TROPONINI <0.03 <0.03   ------------------------------------------------------------------------------------------------------------------  RADIOLOGY:  Dg Chest Port 1 View  Result Date: 12/25/2016 CLINICAL DATA:  Acute respiratory failure EXAM: PORTABLE CHEST 1 VIEW COMPARISON:  Portable chest x-ray of December 22, 2016 FINDINGS: The lungs are mildly hyperinflated. There is increased density at the right base consistent with small pleural effusion. The cardiac silhouette remains enlarged. The pulmonary vascularity is not clearly engorged. Consolidation in the left upper lobe has improved. Subtle hazy increased density in the inferior aspect of the left upper lobe persists. The cardiac silhouette is enlarged. The pulmonary vascularity is normal. There is calcification in the wall of the aortic arch. IMPRESSION: New small right pleural effusion with right basilar atelectasis or pneumonia. Improved appearance of the parenchymal consolidation in the left upper lobe. New subtle density in the left mid lung. Stable cardiomegaly without pulmonary vascular congestion. Thoracic aortic atherosclerosis. Electronically Signed   By: David  Martinique M.D.   On: 12/25/2016 07:15     ASSESSMENT AND PLAN:   Active Problems:   Acute and chronic respiratory failure (acute-on-chronic) ([place])  #  Acute on chronic hypoxic respiratory failure secondary to COPD exacerbation and acute on chronic systolic congestive heart failure On Bipap today. Wean as tolerate  # Acute COPD exacerbation Continue Bipap. Wean as tolerated -IV steroids - Scheduled Nebulizers - Inhalers -Wean O2 as tolerated - Appreciate pulmonary input   # Acute on chronic systolic congestive heart failure -Patient has known EF of 20-25% by echo of January 2017 and now. -Continue lisinopril, Coreg IV lasix stopped No pulm edema on CXR  # Known multiple lung lesions being treated empirically for presumed non-small cell lung cancer -Patient just finished radiation therapy for the right lung at the cancer center -She is being worked up by Dr. Raul Del for questionable cavitary lesion on the left side -Patient completed multiple courses of antibiotics with Ceftin Avelox Levaquin recently over the last several Weeks  # Known history of AAA 3.5 cm per last ultrasound abdomen -Patient follows with vascular surgery Dr. Lucky Cowboy  # DVT prophylaxis subcutaneous Lovenox  All the records are reviewed and case discussed with Care Management/Social Workerr. Management plans discussed with the patient, family and they are in agreement.  CODE STATUS: full.  TOTAL TIME TAKING CARE OF THIS PATIENT: 30 minutes.   POSSIBLE D/C IN 1-2 DAYS, DEPENDING ON CLINICAL CONDITION.   Hillary Bow R M.D on 12/25/2016   Between 7am to 6pm - Pager - [phone]  After 6pm go to [email or web address] - password [email or web address]  Office  [phone]  CC: Primary care physician; Sofie Hartigan, MD  Note: This dictation was prepared with Dragon dictation along with smaller phrase technology. Any transcriptional errors that result from this process are unintentional.

## 2016-12-26 ENCOUNTER — Inpatient Hospital Stay: Payer: Medicare Other

## 2016-12-26 DIAGNOSIS — C3431 Malignant neoplasm of lower lobe, right bronchus or lung: Secondary | ICD-10-CM

## 2016-12-26 LAB — BASIC METABOLIC PANEL
Anion gap: 6 (ref 5–15)
BUN: 32 mg/dL — AB (ref 6–20)
CALCIUM: 9.2 mg/dL (ref 8.9–10.3)
CO2: 38 mmol/L — ABNORMAL HIGH (ref 22–32)
CREATININE: 0.44 mg/dL (ref 0.44–1.00)
Chloride: 90 mmol/L — ABNORMAL LOW (ref 101–111)
GFR calc Af Amer: >60 mL/min (ref >60)
GLUCOSE: 117 mg/dL — AB (ref 65–99)
Potassium: 4.5 mmol/L (ref 3.5–5.1)
Sodium: 134 mmol/L — ABNORMAL LOW (ref 135–145)

## 2016-12-26 MED ORDER — FUROSEMIDE 10 MG/ML IJ SOLN
40.0000 mg | Freq: Once | INTRAMUSCULAR | Status: AC
  Administered 2016-12-26: 40 mg via INTRAVENOUS
  Filled 2016-12-26: qty 4

## 2016-12-26 NOTE — Progress Notes (Signed)
No new complaints. Remains on HF [place]@70 %. Presently comfortable appearing. Denies pain. She and family informed me that she has recently been diagnosed with lung cancer.  Vitals:   12/26/16 0616 12/26/16 0622 12/26/16 0700 12/26/16 0800  BP:   121/78 139/89  Pulse:  78 78 91  Resp:   (!) 24 (!) 31  Temp:    97.6 F (36.4 C)  TempSrc:    Oral  SpO2: (!) 81% 92% (!) 88% 91%  Weight:      Height:       Frail-appearing, NAD Rattling cough persists HEENT WNL No JVD Markedly diminished breath sounds throughout, few scattered wheezes Regular, no M NABS, soft No LE edema  BMP Latest Ref Rng & Units 12/26/2016 12/23/2016 12/22/2016  Glucose 65 - 99 mg/dL 117(H) 129(H) 117(H)  BUN 6 - 20 mg/dL 32(H) 31(H) 31(H)  Creatinine 0.44 - 1.00 mg/dL 0.44 0.61 0.69  Sodium 135 - 145 mmol/L 134(L) 134(L) 135  Potassium 3.5 - 5.1 mmol/L 4.5 4.1 3.8  Chloride 101 - 111 mmol/L 90(L) 88(L) 86(L)  CO2 22 - 32 mmol/L 38(H) 39(H) 38(H)  Calcium 8.9 - 10.3 mg/dL 9.2 9.1 9.4   CBC Latest Ref Rng & Units 12/23/2016 12/22/2016 12/19/2016  WBC 3.6 - 11.0 K/uL 12.1(H) 13.7(H) 9.2  Hemoglobin 12.0 - 16.0 g/dL 12.7 13.0 13.3  Hematocrit 35.0 - 47.0 % 38.0 39.5 39.7  Platelets 150 - 440 K/uL 234 267 264   CXR: Small right apical pneumothorax, cardiomegaly, small right effusion  IMPRESSION: Acute on chronic hypoxic/hypercarbic respiratory failure Severe cardiomyopathy ( LVEF 25-30% by echocardiogram 12/20/16) End-stage COPD with acute exacerbation Recent diagnosis of lung cancer Right pleural effusion - likely due to CHF but might be malignant Right pneumothorax -   PLAN/REC: Continue supplemental oxygen to maintain SPO2 greater than 86% Continue BiPAP as needed Continue nebulized steroids and bronchodilators Continue systemic steroids - dose adjusted Furosemide 1 dose 10/16.  Serial chest x-rays to follow pneumothorax.. A small bore pleural catheter might be required if it progresses Continue mucus  clearance techniques - Bed percussion and flutter valve  We are continuing with the goal of discharge with hospice follow-up. Discussed with palliative care  Merton Border, MD PCCM service Mobile [phone] Pager [phone] 12/26/2016 4:48 PM

## 2016-12-26 NOTE — Progress Notes (Signed)
[facility] responded to an OR for an AD. Pt had been educated previously and wanted to complete. [place] secured a Notary and two witnesses. [place] made a copy of the originals and placed in Pt chart. [place] returned the originals to the Pt.     12/26/16 1100  Clinical Encounter Type  Visited With Patient;Health care provider  Visit Type Initial;Spiritual support  Referral From Nurse  Consult/Referral To Chaplain  Spiritual Encounters  Spiritual Needs Literature

## 2016-12-26 NOTE — Progress Notes (Signed)
Daily Progress Note   Patient Name: Brooke Trevino       Date: 12/26/2016 DOB: [birth year]  Age: 81 y.o. MRN#: [id number] Attending Physician: Hillary Bow, MD Primary Care Physician: Sofie Hartigan, MD Admit Date: 12/19/2016  Reason for Consultation/Follow-up: Establishing goals of care  Subjective: Met with family (Husband, dtr, grand dtr) privately before meeting together with patient.  Family understands that her illness is not curable, and that it will slowly continue to worsen.  We discussed the possibility that Mrs. Tassinari may not leave the hospital.  We met with Mrs. Koo and discussed hospice services.  The patient and family definitely want hospice services on discharge.   Husband has two siblings that have died recently using hospice services and he was very pleased.  We talked about the use of oral morphine.  Morphine seems to have been very helpful to Mrs. Mervin.  Even reducing her anxiety.    Family completed a living will yesterday.  They requested help in having it notarized and witnessed.   Assessment: Extremely pleasant 82 yo female with end stage COPD and heart failure.  New small pneumothorax noted last night.   Family extremely reasonable.  They want to treat the treatable and give the patient quality of life as long as possible.   Patient Profile/HPI:  81 y.o. female  with past medical history of COPD and CHF (ef 20-25%) who was admitted on 12/19/2016 with acute on chronic respiratory failure secondary to COPD exacerbation. She remains in ICU on high flow nasal cannula.    Length of Stay: 7  Current Medications: Scheduled Meds:  . aspirin  81 mg Oral QHS  . budesonide (PULMICORT) nebulizer solution  0.5 mg Nebulization BID  . carvedilol  6.25 mg  Oral BID WC  . chlorhexidine  15 mL Mouth Rinse BID  . citalopram  10 mg Oral Daily  . enoxaparin (LOVENOX) injection  40 mg Subcutaneous Q24H  . famotidine  20 mg Oral Daily  . Influenza vac split quadrivalent PF  0.5 mL Intramuscular Once  . ipratropium-albuterol  3 mL Nebulization Q6H  . lisinopril  10 mg Oral Daily  . mouth rinse  15 mL Mouth Rinse q12n4p  . meloxicam  7.5 mg Oral Daily  . methylPREDNISolone (SOLU-MEDROL) injection  40 mg Intravenous Q12H  .  montelukast  10 mg Oral QHS  . potassium chloride SA  20 mEq Oral Daily  . pravastatin  20 mg Oral QHS  . senna  1 tablet Oral BID    Continuous Infusions:   PRN Meds: acetaminophen **OR** acetaminophen, albuterol, calcium carbonate, guaiFENesin-codeine, morphine injection, morphine CONCENTRATE, [DISCONTINUED] ondansetron **OR** ondansetron (ZOFRAN) IV, polyethylene glycol, temazepam  Physical Exam        Thin frail female.  Alert orientated, coherent.  Has a more difficult time breathing/speaking today than yesterday. CV tachy Resp decreased breath sounds Abdomen soft, nt, nd   Vital Signs: BP 139/89   Pulse 91   Temp 97.6 F (36.4 C) (Oral)   Resp (!) 31   Ht _0  (1.626 m)   Wt 51 kg (112 lb 7 oz)   SpO2 91%   BMI 19.30 kg/m  SpO2: SpO2: 91 % O2 Device: O2 Device: High Flow Nasal Cannula O2 Flow Rate: O2 Flow Rate (L/min): 55 L/min  Intake/output summary:  Intake/Output Summary (Last 24 hours) at 12/26/16 1101 Last data filed at 12/26/16 0555  Gross per 24 hour  Intake              480 ml  Output             1840 ml  Net            -1360 ml   LBM: Last BM Date: 12/25/16 Baseline Weight: Weight: 52.6 kg (115 lb 15.4 oz) Most recent weight: Weight: 51 kg (112 lb 7 oz)       Palliative Assessment/Data:      Patient Active Problem List   Diagnosis Date Noted  . Systolic congestive heart failure ([place])   . Palliative care encounter   . COPD, very severe ([place])   . Acute and chronic respiratory  failure (acute-on-chronic) ([place]) 12/19/2016  . Emphysema lung ([place]) 11/03/2016  . Essential hypertension, benign 11/03/2016  . AAA (abdominal aortic aneurysm) without rupture ([place]) 11/03/2016  . Acute on chronic respiratory failure with hypoxia ([place]) 08/13/2015  . COPD exacerbation ([place]) 08/13/2015  . Congestive dilated cardiomyopathy ([place]) 08/13/2015  . Pneumonia 08/13/2015  . Leg swelling 08/13/2015  . Aortic stenosis 08/13/2015  . Acute systolic CHF (congestive heart failure) ([place]) 08/09/2015    Palliative Care Plan    Recommendations/Plan:  Continue current care per CCM.  Patient wants to treat the treatable.  Her goal is for quality of life as long as possible.  Will request chaplain to notarize AD  CM consult placed to initiate Hospice Services at home.  Would consider scheduling low dose oral morphine BID or TID.  PMT will follow w/ you to support the family.  Code Status:  DNR  Prognosis:  Currently on high flow [place] at 55L with new pnuemothorax and end stage COPD.  Days to weeks depending on her progression.  Discharge Planning:  To Be Determined.  Family is hopeful for home with hospice.  Care plan was discussed with CCM team and family.  Thank you for allowing the Palliative Medicine Team to assist in the care of this patient.  Total time spent:  45 min.     Greater than 50%  of this time was spent counseling and coordinating care related to the above assessment and plan.  Florentina Jenny, PA-C Palliative Medicine  Please contact Palliative MedicineTeam phone at [phone] for questions and concerns between 7 am - 7 pm.   Please see AMION for individual provider pager numbers.

## 2016-12-26 NOTE — Progress Notes (Signed)
Bro[facility] Trevino NAME: Brooke Trevino    MR#:  [id number]  DATE OF BIRTH:  October 07, 1935  SUBJECTIVE:  CHIEF COMPLAINT:   Chief Complaint  Patient presents with  . Respiratory Distress   ON HFNC. Has SOB and feels weak.  REVIEW OF SYSTEMS:  CONSTITUTIONAL: No fever, fatigue or weakness.  EYES: No blurred or double vision.  EARS, NOSE, AND THROAT: No tinnitus or ear pain.  RESPIRATORY: No cough,positive for shortness of breath, wheezing , no hemoptysis.  CARDIOVASCULAR: No chest pain, orthopnea, edema.  GASTROINTESTINAL: No nausea, vomiting, diarrhea or abdominal pain.  GENITOURINARY: No dysuria, hematuria.  ENDOCRINE: No polyuria, nocturia,  HEMATOLOGY: No anemia, easy bruising or bleeding SKIN: No rash or lesion. MUSCULOSKELETAL: No joint pain or arthritis.   NEUROLOGIC: No tingling, numbness, weakness.  PSYCHIATRY: No anxiety or depression.   ROS  DRUG ALLERGIES:  No Known Allergies  VITALS:  Blood pressure 139/89, pulse 91, temperature 97.6 F (36.4 C), temperature source Oral, resp. rate (!) 31, height 5\' 4"  (1.626 m), weight 51 kg (112 lb 7 oz), SpO2 91 %.  PHYSICAL EXAMINATION:   GENERAL:  81 y.o.-year-old patient lying in the bed with resp distress. Thin cachectic critically ill EYES: Pupils equal, round, reactive to light and accommodation. No scleral icterus. Extraocular muscles intact.  HEENT: Head atraumatic, normocephalic. Oropharynx and nasopharynx clear.  NECK:  Supple, no jugular venous distention. No thyroid enlargement, no tenderness.  LUNGS:distant breath sounds bilaterally, no wheezing, rales,rhonchi or crepitation. ++ use of accessory muscles of respiration. On Bipap again today. CARDIOVASCULAR: S1, S2 normal. No murmurs, rubs, or gallops.  ABDOMEN: Soft, nontender, nondistended. Bowel sounds present. No organomegaly or mass.  EXTREMITIES: LE edema NEUROLOGIC: Cranial nerves II through XII are intact.  Muscle strength 5/5 in all extremities. Sensation intact. Gait not checked.  PSYCHIATRIC: The patient is alert and awake SKIN: No obvious rash, lesion, or ulcer.   Physical Exam LABORATORY PANEL:   CBC  Recent Labs Lab 12/23/16 0254  WBC 12.1*  HGB 12.7  HCT 38.0  PLT 234   ------------------------------------------------------------------------------------------------------------------  Chemistries   Recent Labs Lab 12/23/16 0254 12/26/16 0504  NA 134* 134*  K 4.1 4.5  CL 88* 90*  CO2 39* 38*  GLUCOSE 129* 117*  BUN 31* 32*  CREATININE 0.61 0.44  CALCIUM 9.1 9.2  MG 2.4  --    ------------------------------------------------------------------------------------------------------------------  Cardiac Enzymes  Recent Labs Lab 12/20/16 0102 12/20/16 0840  TROPONINI <0.03 <0.03   ------------------------------------------------------------------------------------------------------------------  RADIOLOGY:  Dg Chest Port 1 View  Result Date: 12/26/2016 CLINICAL DATA:  Respiratory failure.  Pneumothorax. EXAM: PORTABLE CHEST 1 VIEW COMPARISON:  12/26/2016 at 0341 hours FINDINGS: The cardiac silhouette remains mildly enlarged. Aortic atherosclerosis is noted. Veiling opacity in the right lower lung is unchanged and consistent with a small to moderate-sized pleural effusion. Mild asymmetric right-sided pulmonary vascular congestion is stable to slightly improved. There is underlying emphysema. Left upper lobe lesion and patchy left midlung airspace opacity are unchanged as is right basilar atelectasis or infiltrate. Small right apical pneumothorax has at most minimally increased in size. There is no mediastinal shift. IMPRESSION: 1. Small right apical pneumothorax, at most minimally enlarged in the interim. 2. Unchanged right pleural effusion and associated atelectasis or infiltrate. Unchanged left midlung infiltrate. Electronically Signed   By: Logan Bores M.D.   On:  12/26/2016 11:00   Dg Chest Port 1 View  Result Date: 12/26/2016 CLINICAL DATA:  81 year old  female with respiratory failure. Subsequent encounter. EXAM: PORTABLE CHEST 1 VIEW COMPARISON:  12/25/2016 chest x-ray.  09/25/2016 PET CT. FINDINGS: Small right apical pneumothorax (approximately 5-10%). Posteriorly layering right-sided pleural effusion. PET CT detected right-sided hypermetabolic lesions not well delineated on present plain film exam. Left upper lobe 6.2 x 2.2 cm lesion unchanged. Asymmetric pulmonary vascular congestion greater on the right. Mild cardiomegaly. Calcified slightly tortuous aorta. IMPRESSION: Small right apical pneumothorax (approximately 5-10%). Posteriorly layering right-sided pleural effusion. PET CT detected right-sided hypermetabolic lesions not well delineated on present plain film exam. Left upper lobe 6.2 x 2.2 cm lesion unchanged. Asymmetric pulmonary vascular congestion greater on the right. Aortic Atherosclerosis (ICD10-I70.0). These results will be called to the ordering clinician or representative by the Radiologist Assistant, and communication documented in the PACS or zVision Dashboard. Electronically Signed   By: Genia Del M.D.   On: 12/26/2016 06:52   Dg Chest Port 1 View  Result Date: 12/25/2016 CLINICAL DATA:  Acute respiratory failure EXAM: PORTABLE CHEST 1 VIEW COMPARISON:  Portable chest x-ray of December 22, 2016 FINDINGS: The lungs are mildly hyperinflated. There is increased density at the right base consistent with small pleural effusion. The cardiac silhouette remains enlarged. The pulmonary vascularity is not clearly engorged. Consolidation in the left upper lobe has improved. Subtle hazy increased density in the inferior aspect of the left upper lobe persists. The cardiac silhouette is enlarged. The pulmonary vascularity is normal. There is calcification in the wall of the aortic arch. IMPRESSION: New small right pleural effusion with right basilar  atelectasis or pneumonia. Improved appearance of the parenchymal consolidation in the left upper lobe. New subtle density in the left mid lung. Stable cardiomegaly without pulmonary vascular congestion. Thoracic aortic atherosclerosis. Electronically Signed   By: David  Martinique M.D.   On: 12/25/2016 07:15    ASSESSMENT AND PLAN:   Active Problems:   Acute and chronic respiratory failure (acute-on-chronic) ([facility])   Systolic congestive heart failure ([facility])   Palliative care encounter   COPD, very severe ([place])  * Apical right pneumothorax Small. Being monitored for now. Repeat CXR ordered for later today  # Acute on chronic hypoxic respiratory failure secondary to COPD exacerbation and acute on chronic systolic congestive heart failure Om HFNC  # Acute COPD exacerbation Continue Bipap. Wean as tolerated - Scheduled Nebulizers - Inhalers -Wean O2 as tolerated - Appreciate pulmonary input  # Acute on chronic systolic congestive heart failure -Patient has known EF of 20-25% by echo of January 2017 and now. -Continue lisinopril, Coreg IV lasix dose today No pulm edema on CXR  # Known multiple lung lesions being treated empirically for presumed non-small cell lung cancer -Patient just finished radiation therapy for the right lung at the cancer center -She is being worked up by Dr. Raul Del for questionable cavitary lesion on the left side -Patient completed multiple courses of antibiotics with Ceftin Avelox Levaquin recently over the last several Weeks  # Known history of AAA 3.5 cm per last ultrasound abdomen -Patient follows with vascular surgery Dr. Lucky Cowboy  # DVT prophylaxis subcutaneous Lovenox  All the records are reviewed and case discussed with Care Management/Social Workerr. Management plans discussed with the patient, family and they are in agreement.  CODE STATUS:  DNR  TOTAL TIME TAKING CARE OF THIS PATIENT: 30 minutes.   Hillary Bow R M.D on 12/26/2016   Between  7am to 6pm - Pager - [phone]  After 6pm go to [email or web address] - [place]  [facility]  Office  [phone]  CC: Primary care physician; Sofie Hartigan, MD  Note: This dictation was prepared with Dragon dictation along with smaller phrase technology. Any transcriptional errors that result from this process are unintentional.

## 2016-12-26 NOTE — Progress Notes (Signed)
Radiology notified this nurse that CXR results showed R apical pneumothorax, approximately 5-10%. Dr. Alva Garnet present on unit and notified. No new orders. Wilnette Kales

## 2016-12-27 ENCOUNTER — Inpatient Hospital Stay: Payer: Medicare Other

## 2016-12-27 DIAGNOSIS — I5023 Acute on chronic systolic (congestive) heart failure: Secondary | ICD-10-CM

## 2016-12-27 LAB — BASIC METABOLIC PANEL
ANION GAP: 9 (ref 5–15)
BUN: 30 mg/dL — ABNORMAL HIGH (ref 6–20)
CALCIUM: 9.2 mg/dL (ref 8.9–10.3)
CO2: 37 mmol/L — ABNORMAL HIGH (ref 22–32)
Chloride: 91 mmol/L — ABNORMAL LOW (ref 101–111)
Creatinine, Ser: 0.64 mg/dL (ref 0.44–1.00)
GFR calc non Af Amer: >60 mL/min (ref >60)
Glucose, Bld: 129 mg/dL — ABNORMAL HIGH (ref 65–99)
POTASSIUM: 4.4 mmol/L (ref 3.5–5.1)
SODIUM: 137 mmol/L (ref 135–145)

## 2016-12-27 LAB — CBC
HCT: 36.7 % (ref 35.0–47.0)
HEMOGLOBIN: 12.1 g/dL (ref 12.0–16.0)
MCH: 30.9 pg (ref 26.0–34.0)
MCHC: 32.9 g/dL (ref 32.0–36.0)
MCV: 93.8 fL (ref 80.0–100.0)
Platelets: 241 10*3/uL (ref 150–440)
RBC: 3.91 MIL/uL (ref 3.80–5.20)
RDW: 14.4 % (ref 11.5–14.5)
WBC: 11.6 10*3/uL — AB (ref 3.6–11.0)

## 2016-12-27 MED ORDER — PREDNISONE 20 MG PO TABS
40.0000 mg | ORAL_TABLET | Freq: Every day | ORAL | Status: DC
  Administered 2016-12-27 – 2016-12-29 (×3): 40 mg via ORAL
  Filled 2016-12-27 (×3): qty 2

## 2016-12-27 MED ORDER — ENSURE ENLIVE PO LIQD
237.0000 mL | Freq: Three times a day (TID) | ORAL | Status: DC
  Administered 2016-12-27 – 2016-12-31 (×10): 237 mL via ORAL

## 2016-12-27 MED ORDER — LISINOPRIL 20 MG PO TABS
20.0000 mg | ORAL_TABLET | Freq: Every day | ORAL | Status: DC
  Administered 2016-12-27 – 2016-12-31 (×5): 20 mg via ORAL
  Filled 2016-12-27 (×5): qty 1

## 2016-12-27 MED ORDER — ADULT MULTIVITAMIN W/MINERALS CH
1.0000 | ORAL_TABLET | Freq: Every day | ORAL | Status: DC
  Administered 2016-12-28: 1 via ORAL
  Filled 2016-12-27: qty 1

## 2016-12-27 NOTE — Progress Notes (Signed)
Reports feeling much better. No new complaints. Remains on HF [place]@60 %. Presently comfortable appearing.   Vitals:   12/27/16 1114 12/27/16 1200 12/27/16 1300 12/27/16 1500  BP:  (!) 143/76 133/76 137/78  Pulse:  75 87 (!) 101  Resp:  (!) 24 17 (!) 31  Temp:   97.8 F (36.6 C)   TempSrc:   Oral   SpO2: (!) 87% (!) 86% 93% 95%  Weight:      Height:       Frail-appearing, NAD HEENT WNL No JVD Markedly diminished breath sounds throughout, no wheezes Regular, no M NABS, soft No LE edema  BMP Latest Ref Rng & Units 12/27/2016 12/26/2016 12/23/2016  Glucose 65 - 99 mg/dL 129(H) 117(H) 129(H)  BUN 6 - 20 mg/dL 30(H) 32(H) 31(H)  Creatinine 0.44 - 1.00 mg/dL 0.64 0.44 0.61  Sodium 135 - 145 mmol/L 137 134(L) 134(L)  Potassium 3.5 - 5.1 mmol/L 4.4 4.5 4.1  Chloride 101 - 111 mmol/L 91(L) 90(L) 88(L)  CO2 22 - 32 mmol/L 37(H) 38(H) 39(H)  Calcium 8.9 - 10.3 mg/dL 9.2 9.2 9.1   CBC Latest Ref Rng & Units 12/27/2016 12/23/2016 12/22/2016  WBC 3.6 - 11.0 K/uL 11.6(H) 12.1(H) 13.7(H)  Hemoglobin 12.0 - 16.0 g/dL 12.1 12.7 13.0  Hematocrit 35.0 - 47.0 % 36.7 38.0 39.5  Platelets 150 - 440 K/uL 241 234 267   CXR: improved to resolved right apical pneumothorax. [place] cardiomegaly. Right pleural effusion perhaps slightly improved  IMPRESSION: Acute on chronic hypoxic/hypercarbic respiratory failure Severe cardiomyopathy ( LVEF 25-30% by echocardiogram 12/20/16) End-stage COPD with acute exacerbation Recent diagnosis of lung cancer Right pleural effusion - likely due to CHF but might be malignant Right pneumothorax - spontaneously resolving Chronic mucus retention  PLAN/REC: Continue supplemental oxygen to maintain SPO2 greater than 86% Continue BiPAP as needed Continue nebulized steroids and bronchodilators Continue systemic steroids - dose adjusted Furosemide 1 dose 10/16.  Continue mucus clearance techniques - Bed percussion and flutter valve  After discharge, I believe that she  would benefit from a chest percussion vest  Per hospital policy, she needs to remain in the ICU/SDU until her oxygen requirements improved  We are continuing with the goal of discharge with hospice follow-up.  Merton Border, MD PCCM service Mobile [phone] Pager [phone] 12/27/2016 3:13 PM

## 2016-12-27 NOTE — Progress Notes (Signed)
Initial Nutrition Assessment  DOCUMENTATION CODES:   Non-severe (moderate) malnutrition in context of chronic illness, Underweight  INTERVENTION:  Provide Ensure Enlive po TID, each supplement provides 350 kcal and 20 grams of protein.  Provide multivitamin with minerals daily.  NUTRITION DIAGNOSIS:   Malnutrition (Moderate) related to chronic illness (COPD, emphysema, lung cancer) as evidenced by moderate depletion of body fat, severe depletion of body fat, moderate depletions of muscle mass.  GOAL:   Patient will meet greater than or equal to 90% of their needs  MONITOR:   PO intake, Supplement acceptance, Labs, Weight trends, Skin, I & O's  REASON FOR ASSESSMENT:   Other (Comment) (Low BMI)    ASSESSMENT:   81 year old female with PMHx of COPD, severe chronic emphysema, cardiomyopathy with EF 25%, presumed non-small cell lung cancer undergoing XRT, hx AAA who presented with increasing shortness of breath found to have acute exacerbation of COPD, right pleural effusion, right pneumothorax.   -PMT following patient.  Met with patient at bedside. She was on HFNC. Patient reports she began experiencing a poor appetite 1-2 days PTA but reports it is starting to pick back up now. She reports she has not eaten much of anything while she has been here. Only 4 meals have been documented during patient's admission, but meal completion variable from 0%/bites to 50%. She reports she had some breakfast today. Before admission she lived at home with her husband, who helped her with preparing meals. She reports she typically only eats 2 small meals per day, but is not able to recall what she may have. She does report that she has to stick with softer foods because she has difficulty chewing, even with her dentures in. Offered to send food chopped, but patient refuses at this time. She used to drink Boost and Ensure, but has not recently because she forgot about it.   Patient reports her  weight fluctuates between 115-117 lbs as her UBW. She reports she was around 114 lbs on admission and has lost a lot of weight this admission from not eating. According to weights in chart patient has lost approximately 9.5 lbs (8.2% body weight) over 12 days, which would be significant for time frame, but unsure that today's weight of 106.5 lbs is accurate.  Meal Completion: 0-50%  Medications reviewed and include: potassium chloride 20 mEq daily, prednisone, senna.  Labs reviewed: Chloride 91, CO2 37, BUN 30.  Nutrition-Focused physical exam completed. Findings are moderate-severe fat depletion (moderate depletion of orbital region and thoracic/lumbar region; severe depletion of upper arm region), moderate muscle depletion (moderate depletion of temple, clavicle bone, clavicle/acromion bone, scapular bone, dorsal hand, patellar, anterior thigh, and posterior calf regions), and no edema.   Discussed with RN. Patient also discussed on rounds.  Diet Order:  Diet regular Room service appropriate? Yes; Fluid consistency: Thin  Skin:  Reviewed, no issues  Last BM:  12/26/2016 - medium type 6   Height:   Ht Readings from Last 1 Encounters:  12/19/16 '5\' 4"'$  (1.626 m)    Weight:   Wt Readings from Last 1 Encounters:  12/27/16 106 lb 7.7 oz (48.3 kg)    Ideal Body Weight:  54.5 kg  BMI:  Body mass index is 18.28 kg/m.  Estimated Nutritional Needs:   Kcal:  1450-1690 (30-35 kcal/kg)  Protein:  70-80 grams (1.4-1.6 grams/kg)  Fluid:  1.2-1.4 L/day (25-30 ml/kg)  EDUCATION NEEDS:   No education needs identified at this time  Willey Blade, MS, RD,  LDN Office: [phone] Pager: [phone] After Hours/Weekend Pager: [phone]

## 2016-12-27 NOTE — Progress Notes (Signed)
Patient's HFNC came out of nose while sleeping, oxygen sats dropped to 50's. Patient placed on bipap for 30 minutes, sats recovered and placed back on HFNC. Wilnette Kales

## 2016-12-27 NOTE — Progress Notes (Signed)
[place] NAME: Madysin Crisp    MR#:  [id number]  DATE OF BIRTH:  [birth year]  SUBJECTIVE:  CHIEF COMPLAINT:   Chief Complaint  Patient presents with  . Respiratory Distress   ON HFNC. Has SOB and feels weak. 60% O2  REVIEW OF SYSTEMS:  CONSTITUTIONAL: No fever, fatigue or weakness.  EYES: No blurred or double vision.  EARS, NOSE, AND THROAT: No tinnitus or ear pain.  RESPIRATORY: No cough,positive for shortness of breath, wheezing , no hemoptysis.  CARDIOVASCULAR: No chest pain, orthopnea, edema.  GASTROINTESTINAL: No nausea, vomiting, diarrhea or abdominal pain.  GENITOURINARY: No dysuria, hematuria.  ENDOCRINE: No polyuria, nocturia,  HEMATOLOGY: No anemia, easy bruising or bleeding SKIN: No rash or lesion. MUSCULOSKELETAL: No joint pain or arthritis.   NEUROLOGIC: No tingling, numbness, weakness.  PSYCHIATRY: No anxiety or depression.   ROS  DRUG ALLERGIES:  No Known Allergies  VITALS:  Blood pressure 137/78, pulse (!) 101, temperature (!) 97.1 F (36.2 C), temperature source Axillary, resp. rate (!) 31, height 5\' 4"  (1.626 m), weight 48.3 kg (106 lb 7.7 oz), SpO2 95 %.  PHYSICAL EXAMINATION:   GENERAL:  81 y.o.-year-old patient lying in the bed with resp distress. Thin cachectic critically ill EYES: Pupils equal, round, reactive to light and accommodation. No scleral icterus. Extraocular muscles intact.  HEENT: Head atraumatic, normocephalic. Oropharynx and nasopharynx clear.  NECK:  Supple, no jugular venous distention. No thyroid enlargement, no tenderness.  LUNGS:distant breath sounds bilaterally, no wheezing, rales,rhonchi or crepitation. ++ use of accessory muscles of respiration. On Bipap again today. CARDIOVASCULAR: S1, S2 normal. No murmurs, rubs, or gallops.  ABDOMEN: Soft, nontender, nondistended. Bowel sounds present. No organomegaly or mass.  EXTREMITIES: LE edema NEUROLOGIC: Cranial nerves II  through XII are intact. Muscle strength 5/5 in all extremities. Sensation intact. Gait not checked.  PSYCHIATRIC: The patient is alert and awake SKIN: No obvious rash, lesion, or ulcer.   Physical Exam LABORATORY PANEL:   CBC  Recent Labs Lab 12/27/16 0327  WBC 11.6*  HGB 12.1  HCT 36.7  PLT 241   ------------------------------------------------------------------------------------------------------------------  Chemistries   Recent Labs Lab 12/23/16 0254  12/27/16 0327  NA 134*  < > 137  K 4.1  < > 4.4  CL 88*  < > 91*  CO2 39*  < > 37*  GLUCOSE 129*  < > 129*  BUN 31*  < > 30*  CREATININE 0.61  < > 0.64  CALCIUM 9.1  < > 9.2  MG 2.4  --   --   < > = values in this interval not displayed. ------------------------------------------------------------------------------------------------------------------  Cardiac Enzymes No results for input(s): TROPONINI in the last 168 hours. ------------------------------------------------------------------------------------------------------------------  RADIOLOGY:  Dg Chest Port 1 View  Result Date: 12/27/2016 CLINICAL DATA:  Respiratory failure. EXAM: PORTABLE CHEST 1 VIEW COMPARISON:  Radiograph of December 26, 2016. FINDINGS: Stable cardiomegaly. Minimal right apical pneumothorax is noted which is significantly smaller compared to prior exam. Stable right pleural effusion is noted with associated atelectasis. Left midlung airspace opacity is unchanged. Bony thorax is unremarkable. Atherosclerosis of thoracic aorta is noted. IMPRESSION: Minimal right apical pneumothorax which is improved compared to prior exam. Stable moderate right pleural effusion. Stable left midlung opacity is noted concerning for infiltrate or atelectasis. Continued radiographic follow-up is recommended to ensure resolution. Electronically Signed   By: Marijo Conception, M.D.   On: 12/27/2016 07:36   Dg Chest [facility]  Result Date: 12/26/2016 CLINICAL DATA:   Respiratory failure and pneumothorax. History of COPD, former smoker, acute on chronic respiratory failure, CHF. EXAM: PORTABLE CHEST 1 VIEW COMPARISON:  Portable chest x-ray of December 26, 2016 FINDINGS: There is a stable appearing approximately 10% right apical pneumothorax. There remains a posterior layering right pleural effusion. There is no mediastinal shift. The left lung is hyperinflated. There is patchy alveolar opacity in the mid lung which is stable. The heart is mildly enlarged but stable. The pulmonary vascularity is normal. There is calcification in the wall of the aortic arch. IMPRESSION: Stable approximately 10% right apical pneumothorax. Stable right pleural effusion. Stable alveolar opacity in the left mid lung. Underlying COPD. Mild cardiomegaly without pulmonary edema. Thoracic aortic atherosclerosis. Electronically Signed   By: David  Martinique M.D.   On: 12/26/2016 14:42   Dg Chest Port 1 View  Result Date: 12/26/2016 CLINICAL DATA:  Respiratory failure.  Pneumothorax. EXAM: PORTABLE CHEST 1 VIEW COMPARISON:  12/26/2016 at 0341 hours FINDINGS: The cardiac silhouette remains mildly enlarged. Aortic atherosclerosis is noted. Veiling opacity in the right lower lung is unchanged and consistent with a small to moderate-sized pleural effusion. Mild asymmetric right-sided pulmonary vascular congestion is stable to slightly improved. There is underlying emphysema. Left upper lobe lesion and patchy left midlung airspace opacity are unchanged as is right basilar atelectasis or infiltrate. Small right apical pneumothorax has at most minimally increased in size. There is no mediastinal shift. IMPRESSION: 1. Small right apical pneumothorax, at most minimally enlarged in the interim. 2. Unchanged right pleural effusion and associated atelectasis or infiltrate. Unchanged left midlung infiltrate. Electronically Signed   By: Logan Bores M.D.   On: 12/26/2016 11:00   Dg Chest Port 1 View  Result Date:  12/26/2016 CLINICAL DATA:  81 year old female with respiratory failure. Subsequent encounter. EXAM: PORTABLE CHEST 1 VIEW COMPARISON:  12/25/2016 chest x-ray.  09/25/2016 PET CT. FINDINGS: Small right apical pneumothorax (approximately 5-10%). Posteriorly layering right-sided pleural effusion. PET CT detected right-sided hypermetabolic lesions not well delineated on present plain film exam. Left upper lobe 6.2 x 2.2 cm lesion unchanged. Asymmetric pulmonary vascular congestion greater on the right. Mild cardiomegaly. Calcified slightly tortuous aorta. IMPRESSION: Small right apical pneumothorax (approximately 5-10%). Posteriorly layering right-sided pleural effusion. PET CT detected right-sided hypermetabolic lesions not well delineated on present plain film exam. Left upper lobe 6.2 x 2.2 cm lesion unchanged. Asymmetric pulmonary vascular congestion greater on the right. Aortic Atherosclerosis (ICD10-I70.0). These results will be called to the ordering clinician or representative by the Radiologist Assistant, and communication documented in the PACS or zVision Dashboard. Electronically Signed   By: Genia Del M.D.   On: 12/26/2016 06:52    ASSESSMENT AND PLAN:   Active Problems:   Acute and chronic respiratory failure (acute-on-chronic) (HCC)   Systolic congestive heart failure (HCC)   Palliative care encounter   COPD, very severe ([place])   Malignant neoplasm of lower lobe of right lung ([place])  # Apical right pneumothorax Small. Being monitored for now.  # Acute on chronic hypoxic respiratory failure secondary to COPD exacerbation and acute on chronic systolic congestive heart failure On HFNC Still acutely ill and in ICU Would target sats 88-90%  # Acute COPD exacerbation Continue Bipap as needed. Wean as tolerated. Now on HFNC - Scheduled Nebulizers - Inhalers -Wean O2 as tolerated - Appreciate pulmonary input  # Acute on chronic systolic congestive heart failure -Patient has known EF  of 20-25% by echo of January 2017 and  now. -Continue lisinopril, Coreg IV lasix dose today No pulm edema on CXR  # Known multiple lung lesions being treated empirically for presumed non-small cell lung cancer -Patient just finished radiation therapy for the right lung at the cancer center -She is being worked up by Dr. Raul Del for questionable cavitary lesion on the left side -Patient completed multiple courses of antibiotics with Ceftin Avelox Levaquin recently over the last several Weeks  # Known history of AAA 3.5 cm per last ultrasound abdomen -Patient follows with vascular surgery Dr. Lucky Cowboy  # DVT prophylaxis subcutaneous Lovenox   CODE STATUS:  DNR  TOTAL TIME TAKING CARE OF THIS PATIENT: 25 minutes.   Hillary Bow R M.D on 12/27/2016   Between 7am to 6pm - Pager - [phone]  After 6pm go to [email or web address] - password [email or web address] [facility]  Office  [phone]  CC: Primary care physician; Sofie Hartigan, MD  Note: This dictation was prepared with Dragon dictation along with smaller phrase technology. Any transcriptional errors that result from this process are unintentional.

## 2016-12-28 MED ORDER — POLYETHYLENE GLYCOL 3350 17 G PO PACK
17.0000 g | PACK | Freq: Every day | ORAL | Status: DC
  Administered 2016-12-29 – 2016-12-31 (×3): 17 g via ORAL
  Filled 2016-12-28 (×3): qty 1

## 2016-12-28 MED ORDER — MORPHINE SULFATE (CONCENTRATE) 10 MG/0.5ML PO SOLN
2.5000 mg | ORAL | Status: DC | PRN
Start: 2016-12-28 — End: 2016-12-31

## 2016-12-28 NOTE — Progress Notes (Signed)
Daily Progress Note   Patient Name: Brooke Trevino       Date: 12/28/2016 DOB: 07-Jul-1935  Age: 81 y.o. MRN#: [id number] Attending Physician: Hillary Bow, MD Primary Care Physician: Sofie Hartigan, MD Admit Date: 12/19/2016  Reason for Consultation/Follow-up: Establishing goals of care and Psychosocial/spiritual support  Subjective: Talked with patient at bedside.  She asked her weight and I told her it was 106 pounds.  She told me that 1 year ago she weighed 137.  She mentioned constipation.  We discussed trying to wean down her oxygen with some help from oral morphine.  She was concerned about becoming addicted.  I attempted to allay her fears.    Expressed my concern to grand dtr Horris Latino that thus far we have been unsuccessful in getting her off of high flow.   Family aware that she will need to get off high flow in order to leave the hospital.   Assessment: End stage COPD with exacerbation.  Lung lesions (presumed NSCLCA).  CHF (25-30%).  Still not weaning from high flow.   Patient Profile/HPI:  81 y.o.femalewith past medical history of COPD and CHF (ef 20-25%)who was admitted on 10/9/2018with acute on chronic respiratory failure secondary to COPD exacerbation. She remains in ICU on high flow nasal cannula.     Length of Stay: 9  Current Medications: Scheduled Meds:  . aspirin  81 mg Oral QHS  . budesonide (PULMICORT) nebulizer solution  0.5 mg Nebulization BID  . carvedilol  6.25 mg Oral BID WC  . chlorhexidine  15 mL Mouth Rinse BID  . citalopram  10 mg Oral Daily  . enoxaparin (LOVENOX) injection  40 mg Subcutaneous Q24H  . feeding supplement (ENSURE ENLIVE)  237 mL Oral TID BM  . ipratropium-albuterol  3 mL Nebulization Q6H  . lisinopril  20 mg Oral Daily    . mouth rinse  15 mL Mouth Rinse q12n4p  . meloxicam  7.5 mg Oral Daily  . montelukast  10 mg Oral QHS  . polyethylene glycol  17 g Oral Daily  . potassium chloride SA  20 mEq Oral Daily  . predniSONE  40 mg Oral Q breakfast  . senna  1 tablet Oral BID    Continuous Infusions:   PRN Meds: acetaminophen **OR** [DISCONTINUED] acetaminophen, albuterol, calcium carbonate, guaiFENesin-codeine, morphine injection,  morphine CONCENTRATE, [DISCONTINUED] ondansetron **OR** ondansetron (ZOFRAN) IV, temazepam  Physical Exam       Thin frail female, resting comfortably, Awake, alert coherent, pleasant Resp no distress on HFNC, but becomes SOB with only a few words. Abdomen thin, soft, NT  Vital Signs: BP 128/70   Pulse 80   Temp 98.8 F (37.1 C) (Axillary)   Resp (!) 29   Ht 5\' 4"  (1.626 m)   Wt 48.5 kg (106 lb 14.8 oz)   SpO2 90%   BMI 18.35 kg/m  SpO2: SpO2: 90 % O2 Device: O2 Device: High Flow Nasal Cannula O2 Flow Rate: O2 Flow Rate (L/min): 50 L/min  Intake/output summary:  Intake/Output Summary (Last 24 hours) at 12/28/16 1240 Last data filed at 12/27/16 1848  Gross per 24 hour  Intake              120 ml  Output              600 ml  Net             -480 ml   LBM: Last BM Date: 12/26/16 Baseline Weight: Weight: 52.6 kg (115 lb 15.4 oz) Most recent weight: Weight: 48.5 kg (106 lb 14.8 oz)       Palliative Assessment/Data: 30%      Patient Active Problem List   Diagnosis Date Noted  . Malignant neoplasm of lower lobe of right lung ([place])   . Systolic congestive heart failure ([place])   . Palliative care encounter   . COPD, very severe ([place])   . Acute and chronic respiratory failure (acute-on-chronic) ([place]) 12/19/2016  . Emphysema lung ([place]) 11/03/2016  . Essential hypertension, benign 11/03/2016  . AAA (abdominal aortic aneurysm) without rupture ([place]) 11/03/2016  . Acute on chronic respiratory failure with hypoxia ([place]) 08/13/2015  . COPD exacerbation ([place])  08/13/2015  . Congestive dilated cardiomyopathy ([place]) 08/13/2015  . Pneumonia 08/13/2015  . Leg swelling 08/13/2015  . Aortic stenosis 08/13/2015  . Acute systolic CHF (congestive heart failure) ([place]) 08/09/2015    Palliative Care Plan    Recommendations/Plan:  Hospice can provide 15L of oxygen at home using two concentrators.    Continue current care per attending physician.  The goal is to get the patient time at her home with hospice services  Oral morphine while weaning from high flow  Bowel regimen increased.  Code Status:  DNR  Prognosis:   Days to weeks pending whether or not she can get down to 10-12L  Discharge Planning:  Home with [place] was discussed with CCM team.  Thank you for allowing the Palliative Medicine Team to assist in the care of this patient.  Total time spent:  35 min.     Greater than 50%  of this time was spent counseling and coordinating care related to the above assessment and plan.  Florentina Jenny, PA-C Palliative Medicine  Please contact Palliative MedicineTeam phone at [phone] for questions and concerns between 7 am - 7 pm.   Please see AMION for individual provider pager numbers.

## 2016-12-28 NOTE — Progress Notes (Signed)
[facility] at [place] NAME: Brooke Trevino    MR#:  [id number]  DATE OF BIRTH:  Jul 11, 1935  SUBJECTIVE:  CHIEF COMPLAINT:   Chief Complaint  Patient presents with  . Respiratory Distress   ON HFNC.  60% O2  REVIEW OF SYSTEMS:  CONSTITUTIONAL: fatigue EYES: No blurred or double vision.  EARS, NOSE, AND THROAT: No tinnitus or ear pain.  RESPIRATORY: No cough,positive for shortness of breath, wheezing , no hemoptysis.  CARDIOVASCULAR: No chest pain, orthopnea, edema.  GASTROINTESTINAL: No nausea, vomiting, diarrhea or abdominal pain.  GENITOURINARY: No dysuria, hematuria.  ENDOCRINE: No polyuria, nocturia,  HEMATOLOGY: No anemia, easy bruising or bleeding SKIN: No rash or lesion. MUSCULOSKELETAL: No joint pain or arthritis.   NEUROLOGIC: No tingling, numbness, weakness.  PSYCHIATRY: No anxiety or depression.   ROS  DRUG ALLERGIES:  No Known Allergies  VITALS:  Blood pressure 125/75, pulse 75, temperature 98.8 F (37.1 C), temperature source Axillary, resp. rate 18, height 5\' 4"  (1.626 m), weight 48.5 kg (106 lb 14.8 oz), SpO2 92 %.  PHYSICAL EXAMINATION:   GENERAL:  81 y.o.-year-old patient lying in the bed with resp distress. Thin cachectic critically ill EYES: Pupils equal, round, reactive to light and accommodation. No scleral icterus. Extraocular muscles intact.  HEENT: Head atraumatic, normocephalic. Oropharynx and nasopharynx clear.  NECK:  Supple, no jugular venous distention. No thyroid enlargement, no tenderness.  LUNGS:distant breath sounds bilaterally, no wheezing, rales,rhonchi or crepitation.  CARDIOVASCULAR: S1, S2 normal. No murmurs, rubs, or gallops.  ABDOMEN: Soft, nontender, nondistended. Bowel sounds present. No organomegaly or mass.  EXTREMITIES: LE edema NEUROLOGIC: Cranial nerves II through XII are intact. Muscle strength 5/5 in all extremities. Sensation intact. Gait not checked.  PSYCHIATRIC: The patient  is alert and awake SKIN: No obvious rash, lesion, or ulcer.   Physical Exam LABORATORY PANEL:   CBC  Recent Labs Lab 12/27/16 0327  WBC 11.6*  HGB 12.1  HCT 36.7  PLT 241   ------------------------------------------------------------------------------------------------------------------  Chemistries   Recent Labs Lab 12/23/16 0254  12/27/16 0327  NA 134*  < > 137  K 4.1  < > 4.4  CL 88*  < > 91*  CO2 39*  < > 37*  GLUCOSE 129*  < > 129*  BUN 31*  < > 30*  CREATININE 0.61  < > 0.64  CALCIUM 9.1  < > 9.2  MG 2.4  --   --   < > = values in this interval not displayed. ------------------------------------------------------------------------------------------------------------------  Cardiac Enzymes No results for input(s): TROPONINI in the last 168 hours. ------------------------------------------------------------------------------------------------------------------  RADIOLOGY:  Dg Chest Port 1 View  Result Date: 12/27/2016 CLINICAL DATA:  Respiratory failure. EXAM: PORTABLE CHEST 1 VIEW COMPARISON:  Radiograph of December 26, 2016. FINDINGS: Stable cardiomegaly. Minimal right apical pneumothorax is noted which is significantly smaller compared to prior exam. Stable right pleural effusion is noted with associated atelectasis. Left midlung airspace opacity is unchanged. Bony thorax is unremarkable. Atherosclerosis of thoracic aorta is noted. IMPRESSION: Minimal right apical pneumothorax which is improved compared to prior exam. Stable moderate right pleural effusion. Stable left midlung opacity is noted concerning for infiltrate or atelectasis. Continued radiographic follow-up is recommended to ensure resolution. Electronically Signed   By: Marijo Conception, M.D.   On: 12/27/2016 07:36   Dg Chest Port 1 View  Result Date: 12/26/2016 CLINICAL DATA:  Respiratory failure and pneumothorax. History of COPD, former smoker, acute on chronic respiratory failure, CHF. EXAM:  PORTABLE CHEST 1 VIEW COMPARISON:  Portable chest x-ray of December 26, 2016 FINDINGS: There is a stable appearing approximately 10% right apical pneumothorax. There remains a posterior layering right pleural effusion. There is no mediastinal shift. The left lung is hyperinflated. There is patchy alveolar opacity in the mid lung which is stable. The heart is mildly enlarged but stable. The pulmonary vascularity is normal. There is calcification in the wall of the aortic arch. IMPRESSION: Stable approximately 10% right apical pneumothorax. Stable right pleural effusion. Stable alveolar opacity in the left mid lung. Underlying COPD. Mild cardiomegaly without pulmonary edema. Thoracic aortic atherosclerosis. Electronically Signed   By: David  Martinique M.D.   On: 12/26/2016 14:42   Dg Chest Port 1 View  Result Date: 12/26/2016 CLINICAL DATA:  Respiratory failure.  Pneumothorax. EXAM: PORTABLE CHEST 1 VIEW COMPARISON:  12/26/2016 at 0341 hours FINDINGS: The cardiac silhouette remains mildly enlarged. Aortic atherosclerosis is noted. Veiling opacity in the right lower lung is unchanged and consistent with a small to moderate-sized pleural effusion. Mild asymmetric right-sided pulmonary vascular congestion is stable to slightly improved. There is underlying emphysema. Left upper lobe lesion and patchy left midlung airspace opacity are unchanged as is right basilar atelectasis or infiltrate. Small right apical pneumothorax has at most minimally increased in size. There is no mediastinal shift. IMPRESSION: 1. Small right apical pneumothorax, at most minimally enlarged in the interim. 2. Unchanged right pleural effusion and associated atelectasis or infiltrate. Unchanged left midlung infiltrate. Electronically Signed   By: Logan Bores M.D.   On: 12/26/2016 11:00    ASSESSMENT AND PLAN:   Active Problems:   Acute and chronic respiratory failure (acute-on-chronic) (HCC)   Systolic congestive heart failure (HCC)    Palliative care encounter   COPD, very severe ([place])   Malignant neoplasm of lower lobe of right lung ([place])  # Apical right pneumothorax Small. Being monitored for now.  # Acute on chronic hypoxic respiratory failure secondary to COPD exacerbation and acute on chronic systolic congestive heart failure On HFNC Still acutely ill and in ICU Would target sats 88-90%  # Acute COPD exacerbation Continue Bipap as needed. Wean as tolerated. Now on HFNC - Scheduled Nebulizers - Inhalers -Wean O2 as tolerated - Appreciate pulmonary input  # Acute on chronic systolic congestive heart failure -Patient has known EF of 20-25% by echo of January 2017 and now. -Continue lisinopril, Coreg IV lasix stopped No pulm edema on CXR  # Known multiple lung lesions being treated empirically for presumed non-small cell lung cancer -Patient just finished radiation therapy for the right lung at the cancer center -She is being worked up by Dr. Raul Del for questionable cavitary lesion on the left side -Patient completed multiple courses of antibiotics with Ceftin Avelox Levaquin recently over the last several Weeks  # Known history of AAA 3.5 cm per last ultrasound abdomen -Patient follows with vascular surgery  # DVT prophylaxis subcutaneous Lovenox  Poor prognosis due to Severe COPD, CHF with EF 25% and Lung cancer.  CODE STATUS:  DNR  TOTAL TIME TAKING CARE OF THIS PATIENT: 25 minutes.   Hillary Bow R M.D on 12/28/2016   Between 7am to 6pm - Pager - [phone]  After 6pm go to [email or web address] - password [email or web address] [facility]  Office  [phone]  CC: Primary care physician; Sofie Hartigan, MD  Note: This dictation was prepared with Dragon dictation along with smaller phrase technology. Any transcriptional errors that result from this process are  unintentional.

## 2016-12-28 NOTE — Progress Notes (Signed)
Patient transported to new room 108 by [place], CNA.

## 2016-12-28 NOTE — Progress Notes (Signed)
Patient refused breakfast tray 

## 2016-12-28 NOTE — Progress Notes (Signed)
Patient encouraged to drink ensure as she refused lunch tray.

## 2016-12-28 NOTE — Progress Notes (Signed)
Telephone report called to [place], Therapist, sports.  Patient to be transported via bed by nurse tech.

## 2016-12-28 NOTE — Progress Notes (Signed)
No major events. No new complaints. Remains on HF [place]@50 %. Presently comfortable.   Vitals:   12/28/16 0800 12/28/16 0900 12/28/16 1000 12/28/16 1100  BP: 125/75 120/70 108/61 119/71  Pulse:  82 87 84  Resp: 18 (!) 22 (!) 21 (!) 29  Temp: 98.8 F (37.1 C)     TempSrc: Axillary     SpO2: 92% 90% 92% 91%  Weight:      Height:       Frail-appearing, NAD HEENT WNL No JVD Markedly diminished breath sounds - especially in RLL, no wheezes Regular, no M NABS, soft No LE edema  BMP Latest Ref Rng & Units 12/27/2016 12/26/2016 12/23/2016  Glucose 65 - 99 mg/dL 129(H) 117(H) 129(H)  BUN 6 - 20 mg/dL 30(H) 32(H) 31(H)  Creatinine 0.44 - 1.00 mg/dL 0.64 0.44 0.61  Sodium 135 - 145 mmol/L 137 134(L) 134(L)  Potassium 3.5 - 5.1 mmol/L 4.4 4.5 4.1  Chloride 101 - 111 mmol/L 91(L) 90(L) 88(L)  CO2 22 - 32 mmol/L 37(H) 38(H) 39(H)  Calcium 8.9 - 10.3 mg/dL 9.2 9.2 9.1   CBC Latest Ref Rng & Units 12/27/2016 12/23/2016 12/22/2016  WBC 3.6 - 11.0 K/uL 11.6(H) 12.1(H) 13.7(H)  Hemoglobin 12.0 - 16.0 g/dL 12.1 12.7 13.0  Hematocrit 35.0 - 47.0 % 36.7 38.0 39.5  Platelets 150 - 440 K/uL 241 234 267   CXR: NNF  IMPRESSION: Acute on chronic hypoxic/hypercarbic respiratory failure Severe cardiomyopathy ( LVEF 25-30% by echocardiogram 12/20/16) End-stage COPD with acute exacerbation Recent diagnosis of lung cancer Right pleural effusion - likely due to CHF but might be malignant Right pneumothorax - spontaneously resolving Chronic mucus retention DNR/DNI  PLAN/REC: Continue supplemental oxygen to maintain SPO2 greater than 86% Continue nebulized steroids and bronchodilators Continue systemic steroids - dose adjusted Continue mucus clearance techniques - Bed percussion and flutter valve  After discharge, I believe that she would benefit from a chest percussion vest Transfer to MedSurg, 1C preferred Goal of discharge to home with hospice follow-up   Palliative care service's assistance  much appreciated  After transfer, PCCM will sign off. Please call if we can be of further assistance. I am happy to be involved in her care in any time in the future including after discharge and have informed the family of this.    Merton Border, MD PCCM service Mobile [phone] Pager [phone] 12/28/2016 11:54 AM

## 2016-12-29 ENCOUNTER — Ambulatory Visit: Payer: Medicare Other | Admitting: Family

## 2016-12-29 MED ORDER — PREDNISONE 20 MG PO TABS
20.0000 mg | ORAL_TABLET | Freq: Every day | ORAL | Status: DC
  Administered 2016-12-30 – 2016-12-31 (×2): 20 mg via ORAL
  Filled 2016-12-29 (×2): qty 1

## 2016-12-29 NOTE — Care Management Important Message (Signed)
Important Message  Patient Details  Name: Brooke Trevino MRN: [id number] Date of Birth: [birth year]   Medicare Important Message Given:  Yes    Shelbie Ammons, RN 12/29/2016, 8:24 AM

## 2016-12-29 NOTE — Progress Notes (Signed)
Decreased to 4l. Pt wears 3.5 lpm at home.

## 2016-12-29 NOTE — Progress Notes (Signed)
[facility] at [place] NAME: Brooke Trevino    MR#:  [id number]  DATE OF BIRTH:  1936-02-07  SUBJECTIVE:  CHIEF COMPLAINT:   Chief Complaint  Patient presents with  . Respiratory Distress   ON HFNC.  On 50% HFNC today. Family at bedside  REVIEW OF SYSTEMS:  CONSTITUTIONAL: fatigue EYES: No blurred or double vision.  EARS, NOSE, AND THROAT: No tinnitus or ear pain.  RESPIRATORY: No cough,positive for shortness of breath, wheezing , no hemoptysis.  CARDIOVASCULAR: No chest pain, orthopnea, edema.  GASTROINTESTINAL: No nausea, vomiting, diarrhea or abdominal pain.  GENITOURINARY: No dysuria, hematuria.  ENDOCRINE: No polyuria, nocturia,  HEMATOLOGY: No anemia, easy bruising or bleeding SKIN: No rash or lesion. MUSCULOSKELETAL: No joint pain or arthritis.   NEUROLOGIC: No tingling, numbness, weakness.  PSYCHIATRY: No anxiety or depression.   ROS  DRUG ALLERGIES:  No Known Allergies  VITALS:  Blood pressure 135/70, pulse 72, temperature (!) 97.4 F (36.3 C), temperature source Oral, resp. rate 17, height 5\' 4"  (1.626 m), weight 47.2 kg (104 lb), SpO2 91 %.  PHYSICAL EXAMINATION:   GENERAL:  81 y.o.-year-old patient lying in the bed with resp distress. Thin cachectic. Lookscritically ill EYES: Pupils equal, round, reactive to light and accommodation. No scleral icterus. Extraocular muscles intact.  HEENT: Head atraumatic, normocephalic. Oropharynx and nasopharynx clear.  NECK:  Supple, no jugular venous distention. No thyroid enlargement, no tenderness.  LUNGS:distant breath sounds bilaterally, no wheezing, rales,rhonchi or crepitation.  CARDIOVASCULAR: S1, S2 normal. No murmurs, rubs, or gallops.  ABDOMEN: Soft, nontender, nondistended. Bowel sounds present. No organomegaly or mass.  EXTREMITIES: LE edema NEUROLOGIC: Cranial nerves II through XII are intact. Muscle strength 5/5 in all extremities. Sensation intact. Gait not  checked.  PSYCHIATRIC: The patient is alert and awake SKIN: No obvious rash, lesion, or ulcer.   Physical Exam LABORATORY PANEL:   CBC  Recent Labs Lab 12/27/16 0327  WBC 11.6*  HGB 12.1  HCT 36.7  PLT 241   ------------------------------------------------------------------------------------------------------------------  Chemistries   Recent Labs Lab 12/23/16 0254  12/27/16 0327  NA 134*  < > 137  K 4.1  < > 4.4  CL 88*  < > 91*  CO2 39*  < > 37*  GLUCOSE 129*  < > 129*  BUN 31*  < > 30*  CREATININE 0.61  < > 0.64  CALCIUM 9.1  < > 9.2  MG 2.4  --   --   < > = values in this interval not displayed. ------------------------------------------------------------------------------------------------------------------  Cardiac Enzymes No results for input(s): TROPONINI in the last 168 hours. ------------------------------------------------------------------------------------------------------------------  RADIOLOGY:  No results found.  ASSESSMENT AND PLAN:   Active Problems:   Acute and chronic respiratory failure (acute-on-chronic) (HCC)   Systolic congestive heart failure (HCC)   Palliative care encounter   COPD, very severe ([place])   Malignant neoplasm of lower lobe of right lung ([place])   Acute on chronic respiratory failure ([place])  # Apical right pneumothorax Small. Being monitored for now. Improved on last xray  # Acute on chronic hypoxic respiratory failure secondary to COPD exacerbation and acute on chronic systolic congestive heart failure On HFNC Still acutely ill Would target sats 88-90%  # Acute COPD exacerbation Continue Bipap as needed. Wean as tolerated. Now on HFNC - Scheduled Nebulizers - Inhalers -Wean O2 as tolerated - Appreciate pulmonary input  # Acute on chronic systolic congestive heart failure -Patient has known EF of 20-25% by echo  of January 2017 and now. -Continue lisinopril, Coreg IV lasix stopped No pulm edema on CXR  #  Known multiple lung lesions being treated empirically for presumed non-small cell lung cancer -Patient just finished radiation therapy for the right lung at the cancer center -She is being worked up by Dr. Raul Del for questionable cavitary lesion on the left side -Patient completed multiple courses of antibiotics with Ceftin Avelox Levaquin recently over the last several Weeks  # Known history of AAA 3.5 cm per last ultrasound abdomen -Patient follows with vascular surgery  # DVT prophylaxis subcutaneous Lovenox  Poor prognosis due to Severe COPD, CHF with EF 25% and Lung cancer.  Patient needs to be on 10 L or less to go home with hospice  CODE STATUS:  DNR  TOTAL TIME TAKING CARE OF THIS PATIENT: 25 minutes.   Hillary Bow R M.D on 12/29/2016   Between 7am to 6pm - Pager - [phone]  After 6pm go to [email or web address] - password [email or web address] [facility]  Office  [phone]  CC: Primary care physician; Sofie Hartigan, MD  Note: This dictation was prepared with Dragon dictation along with smaller phrase technology. Any transcriptional errors that result from this process are unintentional.

## 2016-12-29 NOTE — Progress Notes (Signed)
Placed patient on Nasal Cannula at 5lpm, per Dr. Alva Garnet request. Goal of 85% or better. Will continue to monitor.

## 2016-12-29 NOTE — Progress Notes (Signed)
Asked to reevaluate by Dr. Darvin Neighbours. No major events. No new complaints. Remains on HFNC. Presently comfortable except for the constant alarm from the HF [place] device  [place] device.   Vitals:   12/29/16 0801 12/29/16 0828 12/29/16 1316 12/29/16 1330  BP:  135/70 (!) 149/78   Pulse:  72 76   Resp:  17 20   Temp:  (!) 97.4 F (36.3 C) 97.6 F (36.4 C)   TempSrc:  Oral Oral   SpO2: (!) 89% 91% 96% 91%  Weight:      Height:       Frail-appearing, NAD HEENT WNL No JVD No wheezes Regular, no M NABS, soft No LE edema  BMP Latest Ref Rng & Units 12/27/2016 12/26/2016 12/23/2016  Glucose 65 - 99 mg/dL 129(H) 117(H) 129(H)  BUN 6 - 20 mg/dL 30(H) 32(H) 31(H)  Creatinine 0.44 - 1.00 mg/dL 0.64 0.44 0.61  Sodium 135 - 145 mmol/L 137 134(L) 134(L)  Potassium 3.5 - 5.1 mmol/L 4.4 4.5 4.1  Chloride 101 - 111 mmol/L 91(L) 90(L) 88(L)  CO2 22 - 32 mmol/L 37(H) 38(H) 39(H)  Calcium 8.9 - 10.3 mg/dL 9.2 9.2 9.1   CBC Latest Ref Rng & Units 12/27/2016 12/23/2016 12/22/2016  WBC 3.6 - 11.0 K/uL 11.6(H) 12.1(H) 13.7(H)  Hemoglobin 12.0 - 16.0 g/dL 12.1 12.7 13.0  Hematocrit 35.0 - 47.0 % 36.7 38.0 39.5  Platelets 150 - 440 K/uL 241 234 267   CXR: NNF  IMPRESSION: Acute on chronic hypoxic/hypercarbic respiratory failure Severe cardiomyopathy ( LVEF 25-30% by echocardiogram 12/20/16) End-stage COPD with acute exacerbation Recent diagnosis of lung cancer Right pleural effusion - likely due to CHF but might be malignant Right pneumothorax - spontaneously resolving Chronic mucus retention DNR/DNI  PLAN/REC: Continue supplemental oxygen to maintain SPO2 greater than 86%  Try to transition to conventional [place] Continue nebulized steroids and bronchodilators Continue systemic steroids - dose adjusted 10/19  Would DC home on 10 mg daily and leave her on that dose Continue mucus clearance techniques - Bed percussion and flutter valve  After discharge, I believe that she would benefit from a chest  percussion vest Goal of discharge to home with hospice follow-up   Palliative care service's assistance much appreciated  PCCM will sign off. Please call if we can be of further assistance.  Merton Border, MD PCCM service Mobile [phone] Pager [phone] 12/29/2016 4:04 PM

## 2016-12-30 NOTE — Progress Notes (Signed)
[place] at [place] NAME: Brooke Trevino    MR#:  [id number]  DATE OF BIRTH:  10-20-1935  SUBJECTIVE:she says  feels better than before, less shortness of breath. Slept well last night.on 4 L of oxygen saturation is 100%.  CHIEF COMPLAINT:   Chief Complaint  Patient presents with  . Respiratory Distress     REVIEW OF SYSTEMS:  CONSTITUTIONAL: fatigue EYES: No blurred or double vision.  EARS, NOSE, AND THROAT: No tinnitus or ear pain.  RESPIRATORY: No cough,positive for shortness of breath, wheezing , no hemoptysis.  CARDIOVASCULAR: No chest pain, orthopnea, edema.  GASTROINTESTINAL: No nausea, vomiting, diarrhea or abdominal pain.  GENITOURINARY: No dysuria, hematuria.  ENDOCRINE: No polyuria, nocturia,  HEMATOLOGY: No anemia, easy bruising or bleeding SKIN: No rash or lesion. MUSCULOSKELETAL: No joint pain or arthritis.   NEUROLOGIC: No tingling, numbness, weakness.  PSYCHIATRY: No anxiety or depression.   ROS  DRUG ALLERGIES:  No Known Allergies  VITALS:  Blood pressure 119/76, pulse 70, temperature 98.1 F (36.7 C), temperature source Oral, resp. rate 18, height 5\' 4"  (1.626 m), weight 49.4 kg (109 lb), SpO2 100 %.  PHYSICAL EXAMINATION:   GENERAL:  81 y.o.-year-old patient lying in the bed with resp distress. Thin cachectic. Lookscritically ill EYES: Pupils equal, round, reactive to light and accommodation. No scleral icterus. Extraocular muscles intact.  HEENT: Head atraumatic, normocephalic. Oropharynx and nasopharynx clear.  NECK:  Supple, no jugular venous distention. No thyroid enlargement, no tenderness.  LUNGS:distant breath sounds bilaterally, no wheezing, rales,rhonchi or crepitation.  CARDIOVASCULAR: S1, S2 normal. No murmurs, rubs, or gallops.  ABDOMEN: Soft, nontender, nondistended. Bowel sounds present. No organomegaly or mass.  EXTREMITIES: LE edema NEUROLOGIC: Cranial nerves II through XII are intact.  Muscle strength 5/5 in all extremities. Sensation intact. Gait not checked.  PSYCHIATRIC: The patient is alert and awake SKIN: No obvious rash, lesion, or ulcer.   Physical Exam LABORATORY PANEL:   CBC  Recent Labs Lab 12/27/16 0327  WBC 11.6*  HGB 12.1  HCT 36.7  PLT 241   ------------------------------------------------------------------------------------------------------------------  Chemistries   Recent Labs Lab 12/27/16 0327  NA 137  K 4.4  CL 91*  CO2 37*  GLUCOSE 129*  BUN 30*  CREATININE 0.64  CALCIUM 9.2   ------------------------------------------------------------------------------------------------------------------  Cardiac Enzymes No results for input(s): TROPONINI in the last 168 hours. ------------------------------------------------------------------------------------------------------------------  RADIOLOGY:  No results found.  ASSESSMENT AND PLAN:   Active Problems:   Acute and chronic respiratory failure (acute-on-chronic) (HCC)   Systolic congestive heart failure (HCC)   Palliative care encounter   COPD, very severe ([place])   Malignant neoplasm of lower lobe of right lung ([place])   Acute on chronic respiratory failure ([place])  # Apical right pneumothorax Small. Being monitored for now. Improved   # Acute on chronic hypoxic respiratory failure secondary to COPD exacerbation and acute on chronic systolic congestive heart failure 4 L of oxygen, high flow nasal cannula discontinued, watch on now 24 hours on [place], if stable discharge home tomorrow with hospice.  # Acute COPD exacerbation Continue Bipap as needed. Wean as tolerated. Now on HFNC - Scheduled Nebulizers - Inhalers -Wean O2 as tolerated - Appreciate pulmonary input  # Acute on chronic systolic congestive heart failure -Patient has known EF of 20-25% by echo of January 2017 and now. -Continue lisinopril, Coreg IV lasix stopped No pulm edema on CXR  # Known multiple lung  lesions being treated empirically for presumed non-small  cell lung cancer -Patient just finished radiation therapy for the right lung at the cancer center -She is being worked up by Dr. Raul Del for questionable cavitary lesion on the left side -Patient completed multiple courses of antibiotics with Ceftin Avelox Levaquin recently over the last several Weeks  # Known history of AAA 3.5 cm per last ultrasound abdomen -Patient follows with vascular surgery  # DVT prophylaxis subcutaneous Lovenox  Poor prognosis due to Severe COPD, CHF with EF 25% and Lung cancer.  Patient needs to be on 10 L or less to go home with hospice Likely discharge home tomorrow CODE STATUS:  DNR  TOTAL TIME TAKING CARE OF THIS PATIENT: 25 minutes.   Epifanio Lesches M.D on 12/30/2016   Between 7am to 6pm - Pager - [phone]  After 6pm go to [email or web address] - password [email or web address] [facility]  Office  [phone]  CC: Primary care physician; Sofie Hartigan, MD  Note: This dictation was prepared with Dragon dictation along with smaller phrase technology. Any transcriptional errors that result from this process are unintentional.

## 2016-12-30 NOTE — Care Management Note (Signed)
Case Management Note  Patient Details  Name: Brooke Trevino MRN: [id number] Date of Birth: 09-29-1935  Subjective/Objective:    Discussed discharge planning with Mrs Patch. She chose [facility] to be her in-her-home hospice services provider. She is currently on chronic home oxygen at 4L N/C by [facility]. A referral was faxed to Lorelle Formosa at [facility] requesting in-home hospice services with anticipated discharge home on Sunday 12/31/16. Case Management will continue to follow for discharge planning.               Action/Plan:   Expected Discharge Date:  12/20/16               Expected Discharge Plan:  Home w Hospice Care  In-House Referral:  NA  Discharge planning Services     Post Acute Care Choice:  Hospice Choice offered to:  Patient  DME Arranged:  N/A DME Agency:  NA  HH Arranged:    [place] Agency:  [facility]  Status of Service:  In process, will continue to follow  If discussed at Long Length of Stay Meetings, dates discussed:    Additional Comments:  Sabin Gibeault A, RN 12/30/2016, 10:57 AM

## 2016-12-31 MED ORDER — LISINOPRIL 10 MG PO TABS: 10.0000 mg | ORAL_TABLET | Freq: Every day | ORAL | 0 refills | Status: DC

## 2016-12-31 MED ORDER — PREDNISONE 20 MG PO TABS: 20.0000 mg | ORAL_TABLET | Freq: Every day | ORAL | 0 refills | Status: AC

## 2016-12-31 MED ORDER — ENSURE ENLIVE PO LIQD: 237.0000 mL | Freq: Three times a day (TID) | ORAL | 12 refills | Status: AC

## 2016-12-31 MED ORDER — MORPHINE SULFATE (CONCENTRATE) 10 MG/0.5ML PO SOLN: 2.5000 mg | ORAL | 0 refills | Status: DC | PRN

## 2016-12-31 NOTE — Care Management Note (Signed)
Case Management Note  Patient Details  Name: Brooke Trevino MRN: [id number] Date of Birth: [birth year]  Subjective/Objective:   Fax to Lorelle Formosa at [facility] that Mrs Fifer is being discharged home today with chronic 02 already set up in the home by [facility] Patient.                 Action/Plan:   Expected Discharge Date:  12/31/16               Expected Discharge Plan:  Home w Hospice Care  In-House Referral:  NA  Discharge planning Services     Post Acute Care Choice:  Hospice Choice offered to:  Patient  DME Arranged:  N/A DME Agency:  NA  HH Arranged:    [place] Agency:  [facility]  Status of Service:  Completed, signed off  If discussed at [place] of Stay Meetings, dates discussed:    Additional Comments:  Kashari Chalmers A, RN 12/31/2016, 10:01 AM

## 2016-12-31 NOTE — Discharge Summary (Signed)
Brooke Trevino, is a 81 y.o. female  DOB 1935-09-29  MRN [id number].  Admission date:  12/19/2016  Admitting Physician  Brooke Mandes, MD  Discharge Date:  12/31/2016   Primary MD  Brooke Hartigan, MD  Recommendations for primary care physician for things to follow:   Follow-up with PCP in one week   Admission Diagnosis  Systolic congestive heart failure, unspecified HF chronicity (HCC) [I50.20]   Discharge Diagnosis  Systolic congestive heart failure, unspecified HF chronicity (HCC) [I50.20]   Active Problems:   Acute and chronic respiratory failure (acute-on-chronic) (HCC)   Systolic congestive heart failure (HCC)   Palliative care encounter   COPD, very severe ([facility]oke [facility])   Malignant neoplasm of lower lobe of right lung ([facility])   Acute on chronic respiratory failure ([facility]ino)      Past Medical History:  Diagnosis Date  . Emphysema lung ([facility])   . History of COPD     Past Surgical History:  Procedure Laterality Date  . CHOLECYSTECTOMY    . ear drum     Ear drum replacement       History of present illness and  Hospital Course:     Kindly see H&P for history of present illness and admission details, please review complete Labs, Consult reports and Test reports for all details in brief  HPI  from the history and physical done on the day of admission 81 year old female patient with severe emphysema on 4 L of oxygen, history of non-small cell lung cancer comes in color. Worsening shortness of breath, patient is admitted to ICU and started on BiPAP. [facility] Course  Acute on chronic hypoxic respiratory failure secondary to COPD exacerbation, acute on chronic systolic CHF CHF. admitted to intensive care unit, started on BiPAP, IV Lasix, IV Solu-Medrol, bronchodilators he and is the end of BiPAP slowly,  change it to 50% high flow nasal cannula, initial saturations were in the upper 80s but she is now on 4 L and saturation is 99 -100 %. Seen by intensivist, recommended continuing oxygen and transitioned to nasal cannula, wean down the steroids, continue nebulizers, patient will be discharged home with hospice today on 10 mg prednisone for a long time and she needs mucus clearance techniques with blood percussion, flutter valve, today discharging home with hospice placement. e #2 end-stage COPD #3. severe cardiomyopathy with EF 25-30% by echo done on October 10. For acute on chronic systolic heart failure patient is on Coreg, lisinopril, small dose of Lasix. She received IV Lasix initially. Chest x-ray showed no pulmonary edema.  #4 small apical right pneumothorax: Monitored, x-rays showed improvement. #5. Multiple lung lesions with non-small cell cancer the lung, finished radiation therapy at [facility], patient has been worked by [facility] for questionable cavitary lesion in the left side and finished multiple courses of antibiotics including cefepime, Avelox, Levaquin  #6 .prognosis really poor and CODE STATUS is DO NOT RESUSCITATE and she is going home today with home hospice  Discharge Condition: stable   Follow UP  Follow-up Information    Feldpausch, Chrissie Noa, MD Follow up in 1 week(s).   Specialty:  Family Medicine Contact information: [place] [address] [phone]             Discharge Instructions  and  Discharge Medications      Allergies as of 12/31/2016   No Known Allergies     Medication List    TAKE these medications  albuterol (2.5 MG/3ML) 0.083% nebulizer solution Commonly known as:  PROVENTIL Take 2.5 mg by nebulization every 4 (four) hours as needed for wheezing or shortness of breath.   aspirin 81 MG chewable tablet Chew 81 mg by mouth at bedtime.   calcium carbonate 500 MG chewable tablet Commonly known as:  TUMS - dosed in  mg elemental calcium Chew 1 tablet by mouth as needed for indigestion or heartburn.   carvedilol 6.25 MG tablet Commonly known as:  COREG Take 1 tablet (6.25 mg total) by mouth 2 (two) times daily with a meal.   citalopram 10 MG tablet Commonly known as:  CELEXA Take 10 mg by mouth daily.   feeding supplement (ENSURE ENLIVE) Liqd Take 237 mLs by mouth 3 (three) times daily between meals.   Fluticasone-Salmeterol 250-50 MCG/DOSE Aepb Commonly known as:  ADVAIR Inhale 1 puff into the lungs 2 (two) times daily.   furosemide 40 MG tablet Commonly known as:  LASIX Take 1 tablet (40 mg total) by mouth 2 (two) times daily.   ipratropium-albuterol 0.5-2.5 (3) MG/3ML Soln Commonly known as:  DUONEB Take 3 mLs by nebulization every 4 (four) hours.   lisinopril 10 MG tablet Commonly known as:  PRINIVIL,ZESTRIL Take 1 tablet (10 mg total) by mouth daily. What changed:  how much to take  how to take this   meloxicam 7.5 MG tablet Commonly known as:  MOBIC TAKE 1 TABLET(7.5 MG) BY MOUTH EVERY DAY   montelukast 10 MG tablet Commonly known as:  SINGULAIR Take 10 mg by mouth at bedtime.   morphine CONCENTRATE 10 MG/0.5ML Soln concentrated solution Place 0.13-0.25 mLs (2.6-5 mg total) under the tongue every 2 (two) hours as needed for shortness of breath.   potassium chloride SA 20 MEQ tablet Commonly known as:  K-DUR,KLOR-CON Take 1 tablet (20 mEq total) by mouth daily.   pravastatin 20 MG tablet Commonly known as:  PRAVACHOL Take 20 mg by mouth at bedtime.   predniSONE 20 MG tablet Commonly known as:  DELTASONE Take 1 tablet (20 mg total) by mouth daily with breakfast.   temazepam 30 MG capsule Commonly known as:  RESTORIL Take 30 mg by mouth at bedtime as needed for sleep.         Diet and Activity recommendation: See Discharge Instructions above   Consults obtained - pulmonary, palliative care   Major procedures and Radiology Reports - PLEASE review detailed  and final reports for all details, in brief -      Dg Chest Port 1 View  Result Date: 12/27/2016 CLINICAL DATA:  Respiratory failure. EXAM: PORTABLE CHEST 1 VIEW COMPARISON:  Radiograph of December 26, 2016. FINDINGS: Stable cardiomegaly. Minimal right apical pneumothorax is noted which is significantly smaller compared to prior exam. Stable right pleural effusion is noted with associated atelectasis. Left midlung airspace opacity is unchanged. Bony thorax is unremarkable. Atherosclerosis of thoracic aorta is noted. IMPRESSION: Minimal right apical pneumothorax which is improved compared to prior exam. Stable moderate right pleural effusion. Stable left midlung opacity is noted concerning for infiltrate or atelectasis. Continued radiographic follow-up is recommended to ensure resolution. Electronically Signed   By: Marijo Conception, M.D.   On: 12/27/2016 07:36   Dg Chest Port 1 View  Result Date: 12/26/2016 CLINICAL DATA:  Respiratory failure and pneumothorax. History of COPD, former smoker, acute on chronic respiratory failure, CHF. EXAM: PORTABLE CHEST 1 VIEW COMPARISON:  Portable chest x-ray of December 26, 2016 FINDINGS: There is a stable appearing approximately 10%  right apical pneumothorax. There remains a posterior layering right pleural effusion. There is no mediastinal shift. The left lung is hyperinflated. There is patchy alveolar opacity in the mid lung which is stable. The heart is mildly enlarged but stable. The pulmonary vascularity is normal. There is calcification in the wall of the aortic arch. IMPRESSION: Stable approximately 10% right apical pneumothorax. Stable right pleural effusion. Stable alveolar opacity in the left mid lung. Underlying COPD. Mild cardiomegaly without pulmonary edema. Thoracic aortic atherosclerosis. Electronically Signed   By: David  Martinique M.D.   On: 12/26/2016 14:42   Dg Chest Port 1 View  Result Date: 12/26/2016 CLINICAL DATA:  Respiratory failure.   Pneumothorax. EXAM: PORTABLE CHEST 1 VIEW COMPARISON:  12/26/2016 at 0341 hours FINDINGS: The cardiac silhouette remains mildly enlarged. Aortic atherosclerosis is noted. Veiling opacity in the right lower lung is unchanged and consistent with a small to moderate-sized pleural effusion. Mild asymmetric right-sided pulmonary vascular congestion is stable to slightly improved. There is underlying emphysema. Left upper lobe lesion and patchy left midlung airspace opacity are unchanged as is right basilar atelectasis or infiltrate. Small right apical pneumothorax has at most minimally increased in size. There is no mediastinal shift. IMPRESSION: 1. Small right apical pneumothorax, at most minimally enlarged in the interim. 2. Unchanged right pleural effusion and associated atelectasis or infiltrate. Unchanged left midlung infiltrate. Electronically Signed   By: Logan Bores M.D.   On: 12/26/2016 11:00   Dg Chest Port 1 View  Result Date: 12/26/2016 CLINICAL DATA:  81 year old female with respiratory failure. Subsequent encounter. EXAM: PORTABLE CHEST 1 VIEW COMPARISON:  12/25/2016 chest x-ray.  09/25/2016 PET CT. FINDINGS: Small right apical pneumothorax (approximately 5-10%). Posteriorly layering right-sided pleural effusion. PET CT detected right-sided hypermetabolic lesions not well delineated on present plain film exam. Left upper lobe 6.2 x 2.2 cm lesion unchanged. Asymmetric pulmonary vascular congestion greater on the right. Mild cardiomegaly. Calcified slightly tortuous aorta. IMPRESSION: Small right apical pneumothorax (approximately 5-10%). Posteriorly layering right-sided pleural effusion. PET CT detected right-sided hypermetabolic lesions not well delineated on present plain film exam. Left upper lobe 6.2 x 2.2 cm lesion unchanged. Asymmetric pulmonary vascular congestion greater on the right. Aortic Atherosclerosis (ICD10-I70.0). These results will be called to the ordering clinician or representative  by the Radiologist Assistant, and communication documented in the PACS or zVision Dashboard. Electronically Signed   By: Genia Trevino M.D.   On: 12/26/2016 06:52   Dg Chest Port 1 View  Result Date: 12/25/2016 CLINICAL DATA:  Acute respiratory failure EXAM: PORTABLE CHEST 1 VIEW COMPARISON:  Portable chest x-ray of December 22, 2016 FINDINGS: The lungs are mildly hyperinflated. There is increased density at the right base consistent with small pleural effusion. The cardiac silhouette remains enlarged. The pulmonary vascularity is not clearly engorged. Consolidation in the left upper lobe has improved. Subtle hazy increased density in the inferior aspect of the left upper lobe persists. The cardiac silhouette is enlarged. The pulmonary vascularity is normal. There is calcification in the wall of the aortic arch. IMPRESSION: New small right pleural effusion with right basilar atelectasis or pneumonia. Improved appearance of the parenchymal consolidation in the left upper lobe. New subtle density in the left mid lung. Stable cardiomegaly without pulmonary vascular congestion. Thoracic aortic atherosclerosis. Electronically Signed   By: David  Martinique M.D.   On: 12/25/2016 07:15   Dg Chest Port 1 View  Result Date: 12/22/2016 CLINICAL DATA:  81 year old female with a history of respiratory failure EXAM: PORTABLE CHEST  1 VIEW COMPARISON:  PET-CT 09/25/2016, chest x-ray 12/19/2016 FINDINGS: Cardiomediastinal silhouette unchanged in size and contour with the left heart borders partially obscured by lung and pleural disease. Masslike opacity of the left upper lobe in the region of a prior cavitary lesion, with linear extension towards the left heart border and hilar region. The right-sided FDG avid nodules on prior PET-CT are not well visualized on the current plain film. Opacity at the left base partially obscuring the left hemidiaphragm and left heart border with blunting of the costophrenic angle. Coarsened  interstitial markings on the right. No pneumothorax. IMPRESSION: Recurrent left upper lobe mass in this patient with a prior cavitary lesion at this location is concerning for recurrent infection/inflammation, or potentially post treatment changes/scar. Further evaluation with contrast-enhanced chest CT recommended. Opacity at the left lung base may reflect combination of pleural effusion, atelectasis, and/ or treatment changes. Previously identified FDG avid nodules of the right lung are not as well visualized on the current study. Electronically Signed   By: Corrie Mckusick D.O.   On: 12/22/2016 13:24   Dg Chest Portable 1 View  Result Date: 12/19/2016 CLINICAL DATA:  Pt arrives [facility] emergency traffic from home for respiratory distress. Hx COPD. Pt took 2 duonebs at home, 2 puffs of 2 different inahlers (one known to be albuterol). Pt wears 3.5 L chronically. Was 72% on her 3.5 L. EMS placed pt on bipap, states pt has never been on bipap. Pt has lung masses in each lung. Is being treated for R sided mass. EMS reports expiratory wheezes and diminished on L side. EMS states pt wasn't able to talk much upon their arrival at home. EMS arrives talking in fragments. RT placed pt on [facility] bipap upon arrival. EMS reports pt has oncologist here at [facility].was truncated* EXAM: PORTABLE CHEST 1 VIEW COMPARISON:  09/25/2016 FINDINGS: Mild enlargement of the cardiopericardial silhouette. Atherosclerotic calcification of the aortic arch. Emphysema noted. Prior left upper lobe mass is still present but is less striking compared to the prior exam. The other pulmonary nodules are not readily apparent on conventional radiography. Edema is apparent. IMPRESSION: 1. Reduced size of left upper lobe mass. 2. Mild enlargement of the cardiopericardial silhouette. No overt edema, although the presence of the severe emphysema can mask pulmonary edema. 3.  Aortic Atherosclerosis (ICD10-I70.0). Electronically Signed   By: Van Clines  M.D.   On: 12/19/2016 14:13    Micro Results     No results found for this or any previous visit (from the past 240 hour(s)).     Today   Subjective:   [facility]oke [facility] today has no headache,no chest abdominal pain,no new weakness tingling or numbness, feels much better wants to go home today.   Objective:   Blood pressure 135/69, pulse 65, temperature (!) 97.5 F (36.4 C), temperature source Oral, resp. rate 16, height 5\' 4"  (1.626 m), weight 50 kg (110 lb 3 oz), SpO2 94 %.   Intake/Output Summary (Last 24 hours) at 12/31/16 0834 Last data filed at 12/31/16 0700  Gross per 24 hour  Intake              120 ml  Output              700 ml  Net             -580 ml    Exam Awake Alert, Oriented x 3, No new F.N deficits, Normal affect [place].AT,PERRAL Supple Neck,No JVD, No cervical lymphadenopathy appriciated.  Symmetrical  Chest wall movement, Good air movement bilaterally, CTAB RRR,No Gallops,Rubs or new Murmurs, No Parasternal Heave +ve B.Sounds, Abd Soft, Non tender, No organomegaly appriciated, No rebound -guarding or rigidity. No Cyanosis, Clubbing or edema, No new Rash or bruise  Data Review   CBC w Diff: Lab Results  Component Value Date   WBC 11.6 (H) 12/27/2016   HGB 12.1 12/27/2016   HCT 36.7 12/27/2016   PLT 241 12/27/2016   LYMPHOPCT 10% 08/09/2015   MONOPCT 6% 08/09/2015   EOSPCT 1% 08/09/2015   BASOPCT 1% 08/09/2015    CMP: Lab Results  Component Value Date   NA 137 12/27/2016   K 4.4 12/27/2016   CL 91 (L) 12/27/2016   CO2 37 (H) 12/27/2016   BUN 30 (H) 12/27/2016   CREATININE 0.64 12/27/2016   PROT 6.4 (L) 08/09/2015   ALBUMIN 3.7 08/09/2015   BILITOT 1.0 08/09/2015   ALKPHOS 66 08/09/2015   AST 37 08/09/2015   ALT 27 08/09/2015  .   Total Time in preparing paper work, data evaluation and todays exam - 21 minutes  Priscille Shadduck M.D on 12/31/2016 at 8:34 AM    Note: This dictation was prepared with Dragon dictation along with  smaller phrase technology. Any transcriptional errors that result from this process are unintentional.

## 2016-12-31 NOTE — Progress Notes (Signed)
Pt and husband report ready for discharge home with hospice. Noted SOB on minimal exertion which resolves with rest. Brooke [facility]ino with case management reports referral has been made and ready for discharge. TC with husband informed pt ready for discharge and he reports he has 02 supply he will bring in.

## 2016-12-31 NOTE — Progress Notes (Signed)
Oral and written AVS instructions given to husband, pt and caregiver with stated understanding. Prescriptions for morphine and reglan given. Yellow DNR form in discharge packet. Home 02 concentrator brought in. Pt transported in transport chair to private vehicle with 02 on. Discharge home with hospice support.

## 2017-01-14 ENCOUNTER — Inpatient Hospital Stay
Admission: EM | Admit: 2017-01-14 | Discharge: 2017-01-22 | DRG: 189 | Disposition: A | Discharge status: Home / Self Care | Attending: Internal Medicine | Admitting: Internal Medicine

## 2017-01-14 ENCOUNTER — Emergency Department

## 2017-01-14 ENCOUNTER — Other Ambulatory Visit: Payer: Self-pay

## 2017-01-14 ENCOUNTER — Encounter: Payer: Self-pay | Admitting: Internal Medicine

## 2017-01-14 DIAGNOSIS — I5022 Chronic systolic (congestive) heart failure: Secondary | ICD-10-CM | POA: Diagnosis present

## 2017-01-14 DIAGNOSIS — F419 Anxiety disorder, unspecified: Secondary | ICD-10-CM | POA: Diagnosis present

## 2017-01-14 DIAGNOSIS — I110 Hypertensive heart disease with heart failure: Secondary | ICD-10-CM | POA: Diagnosis present

## 2017-01-14 DIAGNOSIS — J9621 Acute and chronic respiratory failure with hypoxia: Principal | ICD-10-CM | POA: Diagnosis present

## 2017-01-14 DIAGNOSIS — R0603 Acute respiratory distress: Secondary | ICD-10-CM

## 2017-01-14 DIAGNOSIS — B370 Candidal stomatitis: Secondary | ICD-10-CM | POA: Diagnosis present

## 2017-01-14 DIAGNOSIS — J9601 Acute respiratory failure with hypoxia: Secondary | ICD-10-CM

## 2017-01-14 DIAGNOSIS — I5023 Acute on chronic systolic (congestive) heart failure: Secondary | ICD-10-CM | POA: Diagnosis present

## 2017-01-14 DIAGNOSIS — Z923 Personal history of irradiation: Secondary | ICD-10-CM

## 2017-01-14 DIAGNOSIS — K649 Unspecified hemorrhoids: Secondary | ICD-10-CM | POA: Diagnosis present

## 2017-01-14 DIAGNOSIS — I2510 Atherosclerotic heart disease of native coronary artery without angina pectoris: Secondary | ICD-10-CM | POA: Diagnosis present

## 2017-01-14 DIAGNOSIS — Z66 Do not resuscitate: Secondary | ICD-10-CM | POA: Diagnosis present

## 2017-01-14 DIAGNOSIS — F329 Major depressive disorder, single episode, unspecified: Secondary | ICD-10-CM | POA: Diagnosis present

## 2017-01-14 DIAGNOSIS — Z87891 Personal history of nicotine dependence: Secondary | ICD-10-CM

## 2017-01-14 DIAGNOSIS — I959 Hypotension, unspecified: Secondary | ICD-10-CM | POA: Diagnosis present

## 2017-01-14 DIAGNOSIS — D630 Anemia in neoplastic disease: Secondary | ICD-10-CM | POA: Diagnosis present

## 2017-01-14 DIAGNOSIS — E785 Hyperlipidemia, unspecified: Secondary | ICD-10-CM | POA: Diagnosis present

## 2017-01-14 DIAGNOSIS — Z79899 Other long term (current) drug therapy: Secondary | ICD-10-CM

## 2017-01-14 DIAGNOSIS — Z515 Encounter for palliative care: Secondary | ICD-10-CM | POA: Diagnosis present

## 2017-01-14 DIAGNOSIS — Z6820 Body mass index (BMI) 20.0-20.9, adult: Secondary | ICD-10-CM

## 2017-01-14 DIAGNOSIS — J9600 Acute respiratory failure, unspecified whether with hypoxia or hypercapnia: Secondary | ICD-10-CM

## 2017-01-14 DIAGNOSIS — J441 Chronic obstructive pulmonary disease with (acute) exacerbation: Secondary | ICD-10-CM | POA: Diagnosis present

## 2017-01-14 DIAGNOSIS — E440 Moderate protein-calorie malnutrition: Secondary | ICD-10-CM | POA: Diagnosis present

## 2017-01-14 DIAGNOSIS — C3491 Malignant neoplasm of unspecified part of right bronchus or lung: Secondary | ICD-10-CM | POA: Diagnosis present

## 2017-01-14 DIAGNOSIS — Z7982 Long term (current) use of aspirin: Secondary | ICD-10-CM

## 2017-01-14 DIAGNOSIS — J189 Pneumonia, unspecified organism: Secondary | ICD-10-CM | POA: Diagnosis present

## 2017-01-14 DIAGNOSIS — I10 Essential (primary) hypertension: Secondary | ICD-10-CM | POA: Diagnosis present

## 2017-01-14 DIAGNOSIS — Z7952 Long term (current) use of systemic steroids: Secondary | ICD-10-CM

## 2017-01-14 HISTORY — DX: Hyperlipidemia, unspecified: E78.5

## 2017-01-14 HISTORY — DX: Essential (primary) hypertension: I10

## 2017-01-14 HISTORY — DX: Atherosclerotic heart disease of native coronary artery without angina pectoris: I25.10

## 2017-01-14 LAB — CBC WITH DIFFERENTIAL/PLATELET
Basophils Absolute: 0 10*3/uL (ref 0–0.1)
Basophils Relative: 0 %
Eosinophils Absolute: 0.2 10*3/uL (ref 0–0.7)
Eosinophils Relative: 2 %
HEMATOCRIT: 30.5 % — AB (ref 35.0–47.0)
HEMOGLOBIN: 9.8 g/dL — AB (ref 12.0–16.0)
LYMPHS ABS: 0.3 10*3/uL — AB (ref 1.0–3.6)
Lymphocytes Relative: 4 %
MCH: 30 pg (ref 26.0–34.0)
MCHC: 32 g/dL (ref 32.0–36.0)
MCV: 93.8 fL (ref 80.0–100.0)
MONOS PCT: 5 %
Monocytes Absolute: 0.5 10*3/uL (ref 0.2–0.9)
NEUTROS ABS: 8.3 10*3/uL — AB (ref 1.4–6.5)
NEUTROS PCT: 89 %
Platelets: 290 10*3/uL (ref 150–440)
RBC: 3.25 MIL/uL — AB (ref 3.80–5.20)
RDW: 15.4 % — ABNORMAL HIGH (ref 11.5–14.5)
WBC: 9.3 10*3/uL (ref 3.6–11.0)

## 2017-01-14 LAB — COMPREHENSIVE METABOLIC PANEL
ALT: 16 U/L (ref 14–54)
AST: 24 U/L (ref 15–41)
Albumin: 2.3 g/dL — ABNORMAL LOW (ref 3.5–5.0)
Alkaline Phosphatase: 71 U/L (ref 38–126)
Anion gap: 11 (ref 5–15)
BILIRUBIN TOTAL: 0.8 mg/dL (ref 0.3–1.2)
BUN: 27 mg/dL — ABNORMAL HIGH (ref 6–20)
CHLORIDE: 91 mmol/L — AB (ref 101–111)
CO2: 34 mmol/L — ABNORMAL HIGH (ref 22–32)
Calcium: 7.9 mg/dL — ABNORMAL LOW (ref 8.9–10.3)
Creatinine, Ser: 0.58 mg/dL (ref 0.44–1.00)
Glucose, Bld: 115 mg/dL — ABNORMAL HIGH (ref 65–99)
POTASSIUM: 3.8 mmol/L (ref 3.5–5.1)
Sodium: 136 mmol/L (ref 135–145)
TOTAL PROTEIN: 5.9 g/dL — AB (ref 6.5–8.1)

## 2017-01-14 LAB — URINALYSIS, COMPLETE (UACMP) WITH MICROSCOPIC
Bacteria, UA: NONE SEEN
Bilirubin Urine: NEGATIVE
GLUCOSE, UA: NEGATIVE mg/dL
Hgb urine dipstick: NEGATIVE
KETONES UR: NEGATIVE mg/dL
Leukocytes, UA: NEGATIVE
Nitrite: NEGATIVE
PH: 6 (ref 5.0–8.0)
Protein, ur: NEGATIVE mg/dL
SQUAMOUS EPITHELIAL / LPF: NONE SEEN
Specific Gravity, Urine: 1.01 (ref 1.005–1.030)

## 2017-01-14 LAB — BLOOD GAS, VENOUS
ACID-BASE EXCESS: 11.5 mmol/L — AB (ref 0.0–2.0)
BICARBONATE: 39.5 mmol/L — AB (ref 20.0–28.0)
O2 SAT: 60.5 %
PATIENT TEMPERATURE: 37
PCO2 VEN: 75 mmHg — AB (ref 44.0–60.0)
PO2 VEN: 34 mmHg (ref 32.0–45.0)
pH, Ven: 7.33 (ref 7.250–7.430)

## 2017-01-14 LAB — LACTIC ACID, PLASMA
LACTIC ACID, VENOUS: 1.3 mmol/L (ref 0.5–1.9)
Lactic Acid, Venous: 2.2 mmol/L (ref 0.5–1.9)

## 2017-01-14 LAB — PROTIME-INR
INR: 1
Prothrombin Time: 13.1 seconds (ref 11.4–15.2)

## 2017-01-14 LAB — APTT: aPTT: 27 seconds (ref 24–36)

## 2017-01-14 LAB — TROPONIN I: TROPONIN I: 0.04 ng/mL — AB (ref <0.03)

## 2017-01-14 LAB — LIPASE, BLOOD: LIPASE: 21 U/L (ref 11–51)

## 2017-01-14 MED ORDER — IPRATROPIUM-ALBUTEROL 0.5-2.5 (3) MG/3ML IN SOLN
3.0000 mL | Freq: Once | RESPIRATORY_TRACT | Status: AC
  Administered 2017-01-14: 3 mL via RESPIRATORY_TRACT
  Filled 2017-01-14: qty 3

## 2017-01-14 MED ORDER — METHYLPREDNISOLONE SODIUM SUCC 125 MG IJ SOLR
80.0000 mg | Freq: Once | INTRAMUSCULAR | Status: AC
  Administered 2017-01-15: 80 mg via INTRAVENOUS
  Filled 2017-01-14: qty 2

## 2017-01-14 MED ORDER — IOPAMIDOL (ISOVUE-370) INJECTION 76%: 75.0000 mL | Freq: Once | INTRAVENOUS | Status: DC | PRN

## 2017-01-14 MED ORDER — SODIUM CHLORIDE 0.9 % IV BOLUS (SEPSIS)
500.0000 mL | Freq: Once | INTRAVENOUS | Status: AC
  Administered 2017-01-14: 500 mL via INTRAVENOUS

## 2017-01-14 MED ORDER — CEFEPIME HCL 2 G IJ SOLR
2.0000 g | INTRAMUSCULAR | Status: DC
  Administered 2017-01-15 – 2017-01-21 (×7): 2 g via INTRAVENOUS
  Filled 2017-01-14 (×9): qty 2

## 2017-01-14 MED ORDER — DEXTROSE 5 % IV SOLN
2.0000 g | Freq: Once | INTRAVENOUS | Status: AC
  Administered 2017-01-14: 2 g via INTRAVENOUS
  Filled 2017-01-14: qty 2

## 2017-01-14 MED ORDER — VANCOMYCIN HCL IN DEXTROSE 1-5 GM/200ML-% IV SOLN
1000.0000 mg | INTRAVENOUS | Status: DC
  Filled 2017-01-14: qty 200

## 2017-01-14 MED ORDER — VANCOMYCIN HCL IN DEXTROSE 1-5 GM/200ML-% IV SOLN
1000.0000 mg | Freq: Once | INTRAVENOUS | Status: AC
  Administered 2017-01-14: 1000 mg via INTRAVENOUS
  Filled 2017-01-14: qty 200

## 2017-01-14 MED ORDER — ALBUTEROL SULFATE (2.5 MG/3ML) 0.083% IN NEBU
5.0000 mg | INHALATION_SOLUTION | Freq: Once | RESPIRATORY_TRACT | Status: AC
  Administered 2017-01-14: 5 mg via RESPIRATORY_TRACT
  Filled 2017-01-14: qty 6

## 2017-01-14 MED ORDER — SODIUM CHLORIDE 0.9 % IV BOLUS (SEPSIS)
1000.0000 mL | Freq: Once | INTRAVENOUS | Status: AC
  Administered 2017-01-14: 1000 mL via INTRAVENOUS

## 2017-01-14 NOTE — Progress Notes (Signed)
ANTIBIOTIC CONSULT NOTE - INITIAL  Pharmacy Consult for cefepime and vancomycin Indication: PNA  No Known Allergies  Patient Measurements: Height: 5\' 4"  (162.6 cm) Weight: 110 lb (49.9 kg) IBW/kg (Calculated) : 54.7 Adjusted Body Weight:   Vital Signs: Temp: 98.4 F (36.9 C) (11/04 1935) Temp Source: Oral (11/04 1935) BP: 85/64 (11/04 2030) Pulse Rate: 115 (11/04 1935) Intake/Output from previous day: No intake/output data recorded. Intake/Output from this shift: No intake/output data recorded.  Labs: No results for input(s): WBC, HGB, PLT, LABCREA, CREATININE in the last 72 hours. Estimated Creatinine Clearance: 44.9 mL/min (by C-G formula based on SCr of 0.64 mg/dL). No results for input(s): VANCOTROUGH, VANCOPEAK, VANCORANDOM, GENTTROUGH, GENTPEAK, GENTRANDOM, TOBRATROUGH, TOBRAPEAK, TOBRARND, AMIKACINPEAK, AMIKACINTROU, AMIKACIN in the last 72 hours.   Microbiology: Recent Results (from the past 720 hour(s))  MRSA PCR Screening     Status: None   Collection Time: 12/19/16  6:39 PM  Result Value Ref Range Status   MRSA by PCR NEGATIVE NEGATIVE Final    Comment:        The GeneXpert MRSA Assay (FDA approved for NASAL specimens only), is one component of a comprehensive MRSA colonization surveillance program. It is not intended to diagnose MRSA infection nor to guide or monitor treatment for MRSA infections.     Medical History: Past Medical History:  Diagnosis Date  . Emphysema lung ([place])   . History of COPD     Medications:  Infusions:  . ceFEPime (MAXIPIME) IV 2 g (01/14/17 2042)  . [START ON 01/15/2017] ceFEPime (MAXIPIME) IV    . sodium chloride 1,000 mL (01/14/17 2042)   And  . sodium chloride    . vancomycin    . [START ON 01/15/2017] vancomycin     Assessment: 81 yof pharmacy consulted to dose cefepime and vancomycin for PNA.  Goal of Therapy:  Resolve infection Prevent ADE Vancomycin trough 15 to 20 mcg/mL  Plan:  1. Cefepime 2 gm IV  Q24H 2. Vancomycin 1 gm x 1 followed in approximately 16 hours (stacked dosing) by vancomycin 1 gm IV Q24H, predicted trough 17 mcg/mL. Pharmacy will continue to follow and adjust as needed to maintain trough 15 to 20 mcg/mL.   Vd 34.9 L, Ke 0.042 hr-1, T1/2 16.6 hr  Laural Benes, Pharm.D., BCPS Clinical Pharmacist 01/14/2017,8:46 PM

## 2017-01-14 NOTE — ED Provider Notes (Signed)
[facility] Emergency Department Provider Note  ____________________________________________  Time seen: Approximately 10:25 PM  I have reviewed the triage vital signs and the nursing notes.   HISTORY  Chief Complaint Respiratory Distress  Level 5 Caveat: Portions of the History and Physical are unable to be obtained due to critical illness limiting ability to provide history HPI Brooke Trevino is a 81 y.o. female with severe end-stage COPD, on 4 L nasal cannula at all times, had sudden worsening shortness of breath today. Placed on CPAP by EMS. On 40% FiO2 BiPAP which was initiated on arrival, oxygen saturation was only 83%, so FiO2 was increased to 60% which increased her sat to about 93%.  Patient had recent hospitalization, was discharged home is under care of hospice. Her status is DO NOT RESUSCITATE, DO NOT INTUBATE, which family confirmed. Otherwise she would want treatment for any reversible illness. No fever or vomiting or diarrhea.     Past Medical History:  Diagnosis Date  . Emphysema lung ([place])   . History of COPD      Patient Active Problem List   Diagnosis Date Noted  . Acute on chronic respiratory failure ([place])   . Malignant neoplasm of lower lobe of right lung ([place])   . Systolic congestive heart failure ([place])   . Palliative care encounter   . COPD, very severe ([place])   . Acute and chronic respiratory failure (acute-on-chronic) ([place]) 12/19/2016  . Emphysema lung ([place]) 11/03/2016  . Essential hypertension, benign 11/03/2016  . AAA (abdominal aortic aneurysm) without rupture ([place]) 11/03/2016  . Acute on chronic respiratory failure with hypoxia ([place]) 08/13/2015  . COPD exacerbation ([place]) 08/13/2015  . Congestive dilated cardiomyopathy ([place]) 08/13/2015  . Pneumonia 08/13/2015  . Leg swelling 08/13/2015  . Aortic stenosis 08/13/2015  . Acute systolic CHF (congestive heart failure) ([place]) 08/09/2015     Past Surgical History:   Procedure Laterality Date  . CHOLECYSTECTOMY    . ear drum     Ear drum replacement     Prior to Admission medications   Medication Sig Start Date End Date Taking? Authorizing Provider  aspirin 81 MG chewable tablet Chew 81 mg by mouth at bedtime.   Yes [provider]  calcium carbonate (TUMS - DOSED IN MG ELEMENTAL CALCIUM) 500 MG chewable tablet Chew 1 tablet by mouth as needed for indigestion or heartburn.   Yes [provider]  carvedilol (COREG) 6.25 MG tablet Take 1 tablet (6.25 mg total) by mouth 2 (two) times daily with a meal. 08/13/15  Yes Theodoro Grist, MD  citalopram (CELEXA) 10 MG tablet Take 10 mg by mouth daily.   Yes [provider]  Fluticasone-Salmeterol (ADVAIR) 250-50 MCG/DOSE AEPB Inhale 1 puff into the lungs 2 (two) times daily.   Yes [provider]  furosemide (LASIX) 40 MG tablet Take 1 tablet (40 mg total) by mouth 2 (two) times daily. 08/13/15  Yes Theodoro Grist, MD  lisinopril (PRINIVIL,ZESTRIL) 10 MG tablet Take 1 tablet (10 mg total) by mouth daily. 12/31/16  Yes Epifanio Lesches, MD  meloxicam (MOBIC) 7.5 MG tablet TAKE 1 TABLET(7.5 MG) BY MOUTH EVERY DAY 07/21/16  Yes [provider]  metoCLOPramide (REGLAN) 10 MG tablet Take 10 mg 3 (three) times daily by mouth.   Yes [provider]  montelukast (SINGULAIR) 10 MG tablet Take 10 mg by mouth at bedtime.   Yes [provider]  Morphine Sulfate (MORPHINE CONCENTRATE) 10 MG/0.5ML SOLN concentrated solution Place 0.13-0.25 mLs (2.6-5  mg total) under the tongue every 2 (two) hours as needed for shortness of breath. 12/31/16  Yes Epifanio Lesches, MD  potassium chloride SA (K-DUR,KLOR-CON) 20 MEQ tablet Take 1 tablet (20 mEq total) by mouth daily. 08/13/15  Yes Theodoro Grist, MD  pravastatin (PRAVACHOL) 20 MG tablet Take 20 mg by mouth at bedtime.   Yes [provider]  predniSONE (DELTASONE) 20 MG tablet Take 1 tablet (20 mg total) by mouth  daily with breakfast. 12/31/16  Yes Epifanio Lesches, MD  temazepam (RESTORIL) 30 MG capsule Take 30 mg by mouth at bedtime as needed for sleep.   Yes [provider]  albuterol (PROVENTIL) (2.5 MG/3ML) 0.083% nebulizer solution Take 2.5 mg by nebulization every 4 (four) hours as needed for wheezing or shortness of breath.    [provider]  feeding supplement, ENSURE ENLIVE, (ENSURE ENLIVE) LIQD Take 237 mLs by mouth 3 (three) times daily between meals. 12/31/16   Epifanio Lesches, MD  ipratropium-albuterol (DUONEB) 0.5-2.5 (3) MG/3ML SOLN Take 3 mLs by nebulization every 4 (four) hours.    [provider]     Allergies Patient has no known allergies.   Family History  Problem Relation Age of Onset  . Breast cancer Neg Hx     Social History Social History   Tobacco Use  . Smoking status: Former Smoker    Last attempt to quit: 08/08/2005    Years since quitting: 11.4  . Smokeless tobacco: Never Used  Substance Use Topics  . Alcohol use: No  . Drug use: Not on file    Review of Systems Unable to reliably obtained due to her critical illness  ____________________________________________   PHYSICAL EXAM:  VITAL SIGNS: ED Triage Vitals  Enc Vitals Group     BP 01/14/17 1930 (!) 56/44     Pulse Rate 01/14/17 1935 (!) 115     Resp 01/14/17 1930 (!) 22     Temp 01/14/17 1935 98.4 F (36.9 C)     Temp Source 01/14/17 1935 Oral     SpO2 01/14/17 1924 (!) 60 %     Weight 01/14/17 1937 110 lb (49.9 kg)     Height 01/14/17 1937 5\' 4"  (1.626 m)     Head Circumference --      Peak Flow --      Pain Score 01/14/17 1933 0     Pain Loc --      Pain Edu? --      Excl. in [place]? --     Vital signs reviewed, nursing assessments reviewed.   Constitutional:   Alert and oriented. Respiratory distress Eyes:   No scleral icterus.  EOMI. No nystagmus. No conjunctival pallor. PERRL. ENT   Head:   Normocephalic and atraumatic.   Nose:   No  congestion/rhinnorhea.    Mouth/Throat:   MMM, no pharyngeal erythema. No peritonsillar mass.    Neck:   No meningismus. Full ROM. Hematological/Lymphatic/Immunilogical:   No cervical lymphadenopathy. Cardiovascular:   Tachycardia heart rate 1:15. Symmetric bilateral radial and DP pulses.  No murmurs.  Respiratory:   Tachypnea, accessory muscle use, diminished air entry in all lung fields Gastrointestinal:   Soft and nontender. Non distended. There is no CVA tenderness.  No rebound, rigidity, or guarding. Genitourinary:   deferred Musculoskeletal:   Normal range of motion in all extremities. No joint effusions.  No lower extremity tenderness.  No edema. Neurologic:   Normal speech and language.  Motor grossly intact. No gross focal neurologic deficits  are appreciated.  Skin:    Skin is warm, dry and intact. No rash noted.  No petechiae, purpura, or bullae.  ____________________________________________    LABS (pertinent positives/negatives) (all labs ordered are listed, but only abnormal results are displayed) Labs Reviewed  LACTIC ACID, PLASMA - Abnormal; Notable for the following components:      Result Value   Lactic Acid, Venous 2.2 (*)    All other components within normal limits  BLOOD GAS, VENOUS - Abnormal; Notable for the following components:   pCO2, Ven 75 (*)    Bicarbonate 39.5 (*)    Acid-Base Excess 11.5 (*)    All other components within normal limits  URINE CULTURE  CULTURE, BLOOD (ROUTINE X 2)  CULTURE, BLOOD (ROUTINE X 2)  LACTIC ACID, PLASMA  CBC WITH DIFFERENTIAL/PLATELET  URINALYSIS, COMPLETE (UACMP) WITH MICROSCOPIC  PROTIME-INR  APTT  CBC WITH DIFFERENTIAL/PLATELET  COMPREHENSIVE METABOLIC PANEL  LIPASE, BLOOD  PROCALCITONIN  TROPONIN I   ____________________________________________   EKG  Interpreted by me Sinus tachycardia rate 1:15, left axis, normal intervals. Normal QRS ST segments and T waves.  LVH.  ____________________________________________    RADIOLOGY  Dg Chest Port 1 View  Result Date: 01/14/2017 CLINICAL DATA:  Respiratory distress. EXAM: PORTABLE CHEST 1 VIEW COMPARISON:  Chest radiograph 12/27/2016. FINDINGS: Monitoring leads overlie the patient. Stable enlarged cardiac and mediastinal contours. Small bilateral pleural effusions, left-greater-than-right. Bilateral perihilar interstitial pulmonary opacities. No pneumothorax. Known lung lesions not as well visualized on current exam. IMPRESSION: Cardiomegaly with bilateral interstitial opacities which may represent edema. Infection not excluded. Small left-greater-than-right pleural effusions. Electronically Signed   By: Lovey Newcomer M.D.   On: 01/14/2017 21:05    ____________________________________________   PROCEDURES Procedures CRITICAL CARE Performed by: Joni Fears, Soriya Worster   Total critical care time: 35 minutes  Critical care time was exclusive of separately billable procedures and treating other patients.  Critical care was necessary to treat or prevent imminent or life-threatening deterioration.  Critical care was time spent personally by me on the following activities: development of treatment plan with patient and/or surrogate as well as nursing, discussions with consultants, evaluation of patient's response to treatment, examination of patient, obtaining history from patient or surrogate, ordering and performing treatments and interventions, ordering and review of laboratory studies, ordering and review of radiographic studies, pulse oximetry and re-evaluation of patient's condition.  ____________________________________________   DIFFERENTIAL DIAGNOSIS  ACS, PE, pneumonia, pneumothorax, COPD exacerbation  CLINICAL IMPRESSION / ASSESSMENT AND PLAN / ED COURSE  Pertinent labs & imaging results that were available during my care of the patient were reviewed by me and considered in my medical decision  making (see chart for details).   With patient presents with respiratory distress, hypoxia with tachycardia. With recent hospitalization and being bedbound over the past couple weeks, elevated risk for pulmonary embolism. Also most likely COPD. Check chest x-ray, labs. Plan for CT angiogram of the chest. Patient will need admission due to her hypoxic respiratory failure.  Clinical Course as of Jan 15 2324  Nancy Fetter Jan 14, 2017  2024 Care d/w family at bedside. They confirm DNR/DNI, but want treatment and stabilization of acute medical issues otherwise. Pt is under hospice care at home. Plan for sepsis workup, CTA chest. CXR shows severe underlying disease without frank edema or consolidation or ptx.   [PS]  2025 Compensated resp acidosis pCO2, Ven: (!!) 75 [PS]    Clinical Course User Index [PS] Carrie Mew, MD     ----------------------------------------- 11:24 PM  on 01/14/2017 -----------------------------------------  Case discussed with hospitalist. Plan for admission pending lab results and CT chest.  ____________________________________________   FINAL CLINICAL IMPRESSION(S) / ED DIAGNOSES    Final diagnoses:  Acute respiratory failure with hypoxia ([place])  Respiratory distress      This SmartLink is deprecated. Use AVSMEDLIST instead to display the medication list for a patient.   Portions of this note were generated with dragon dictation software. Dictation errors may occur despite best attempts at proofreading.    Carrie Mew, MD 01/14/17 2325

## 2017-01-15 ENCOUNTER — Other Ambulatory Visit: Payer: Self-pay

## 2017-01-15 DIAGNOSIS — Z79899 Other long term (current) drug therapy: Secondary | ICD-10-CM | POA: Diagnosis not present

## 2017-01-15 DIAGNOSIS — Z87891 Personal history of nicotine dependence: Secondary | ICD-10-CM | POA: Diagnosis not present

## 2017-01-15 DIAGNOSIS — Z515 Encounter for palliative care: Secondary | ICD-10-CM | POA: Diagnosis present

## 2017-01-15 DIAGNOSIS — F329 Major depressive disorder, single episode, unspecified: Secondary | ICD-10-CM | POA: Diagnosis present

## 2017-01-15 DIAGNOSIS — J189 Pneumonia, unspecified organism: Secondary | ICD-10-CM | POA: Diagnosis present

## 2017-01-15 DIAGNOSIS — F419 Anxiety disorder, unspecified: Secondary | ICD-10-CM | POA: Diagnosis present

## 2017-01-15 DIAGNOSIS — J441 Chronic obstructive pulmonary disease with (acute) exacerbation: Secondary | ICD-10-CM | POA: Diagnosis not present

## 2017-01-15 DIAGNOSIS — B370 Candidal stomatitis: Secondary | ICD-10-CM | POA: Diagnosis present

## 2017-01-15 DIAGNOSIS — E440 Moderate protein-calorie malnutrition: Secondary | ICD-10-CM | POA: Diagnosis present

## 2017-01-15 DIAGNOSIS — Z7982 Long term (current) use of aspirin: Secondary | ICD-10-CM | POA: Diagnosis not present

## 2017-01-15 DIAGNOSIS — I5022 Chronic systolic (congestive) heart failure: Secondary | ICD-10-CM | POA: Diagnosis not present

## 2017-01-15 DIAGNOSIS — E785 Hyperlipidemia, unspecified: Secondary | ICD-10-CM | POA: Diagnosis present

## 2017-01-15 DIAGNOSIS — Z6820 Body mass index (BMI) 20.0-20.9, adult: Secondary | ICD-10-CM | POA: Diagnosis not present

## 2017-01-15 DIAGNOSIS — I110 Hypertensive heart disease with heart failure: Secondary | ICD-10-CM | POA: Diagnosis present

## 2017-01-15 DIAGNOSIS — Z7952 Long term (current) use of systemic steroids: Secondary | ICD-10-CM | POA: Diagnosis not present

## 2017-01-15 DIAGNOSIS — J9621 Acute and chronic respiratory failure with hypoxia: Principal | ICD-10-CM

## 2017-01-15 DIAGNOSIS — I5023 Acute on chronic systolic (congestive) heart failure: Secondary | ICD-10-CM | POA: Diagnosis present

## 2017-01-15 DIAGNOSIS — C3491 Malignant neoplasm of unspecified part of right bronchus or lung: Secondary | ICD-10-CM | POA: Diagnosis present

## 2017-01-15 DIAGNOSIS — I2510 Atherosclerotic heart disease of native coronary artery without angina pectoris: Secondary | ICD-10-CM | POA: Diagnosis present

## 2017-01-15 DIAGNOSIS — R0603 Acute respiratory distress: Secondary | ICD-10-CM | POA: Diagnosis present

## 2017-01-15 DIAGNOSIS — Z923 Personal history of irradiation: Secondary | ICD-10-CM | POA: Diagnosis not present

## 2017-01-15 DIAGNOSIS — I959 Hypotension, unspecified: Secondary | ICD-10-CM | POA: Diagnosis present

## 2017-01-15 DIAGNOSIS — D630 Anemia in neoplastic disease: Secondary | ICD-10-CM | POA: Diagnosis present

## 2017-01-15 DIAGNOSIS — Z66 Do not resuscitate: Secondary | ICD-10-CM | POA: Diagnosis present

## 2017-01-15 DIAGNOSIS — K649 Unspecified hemorrhoids: Secondary | ICD-10-CM | POA: Diagnosis present

## 2017-01-15 LAB — CBC
HCT: 28.3 % — ABNORMAL LOW (ref 35.0–47.0)
HEMOGLOBIN: 9.2 g/dL — AB (ref 12.0–16.0)
MCH: 30 pg (ref 26.0–34.0)
MCHC: 32.3 g/dL (ref 32.0–36.0)
MCV: 92.8 fL (ref 80.0–100.0)
PLATELETS: 288 10*3/uL (ref 150–440)
RBC: 3.05 MIL/uL — AB (ref 3.80–5.20)
RDW: 15.5 % — ABNORMAL HIGH (ref 11.5–14.5)
WBC: 7.6 10*3/uL (ref 3.6–11.0)

## 2017-01-15 LAB — BASIC METABOLIC PANEL
ANION GAP: 9 (ref 5–15)
BUN: 24 mg/dL — ABNORMAL HIGH (ref 6–20)
CALCIUM: 8.1 mg/dL — AB (ref 8.9–10.3)
CO2: 32 mmol/L (ref 22–32)
CREATININE: 0.53 mg/dL (ref 0.44–1.00)
Chloride: 94 mmol/L — ABNORMAL LOW (ref 101–111)
GFR calc non Af Amer: >60 mL/min (ref >60)
Glucose, Bld: 133 mg/dL — ABNORMAL HIGH (ref 65–99)
Potassium: 4.1 mmol/L (ref 3.5–5.1)
SODIUM: 135 mmol/L (ref 135–145)

## 2017-01-15 LAB — TROPONIN I
Troponin I: 0.03 ng/mL
Troponin I: 0.04 ng/mL

## 2017-01-15 LAB — PROCALCITONIN
PROCALCITONIN: 1.55 ng/mL
PROCALCITONIN: 2.84 ng/mL

## 2017-01-15 LAB — GLUCOSE, CAPILLARY: GLUCOSE-CAPILLARY: 90 mg/dL (ref 65–99)

## 2017-01-15 LAB — MRSA PCR SCREENING: MRSA by PCR: NEGATIVE

## 2017-01-15 MED ORDER — SODIUM CHLORIDE 0.9 % IV SOLN
INTRAVENOUS | Status: DC
  Administered 2017-01-15: 03:00:00 via INTRAVENOUS

## 2017-01-15 MED ORDER — METHYLPREDNISOLONE SODIUM SUCC 40 MG IJ SOLR
20.0000 mg | Freq: Two times a day (BID) | INTRAMUSCULAR | Status: DC
  Administered 2017-01-15 – 2017-01-18 (×6): 20 mg via INTRAVENOUS
  Filled 2017-01-15 (×6): qty 1

## 2017-01-15 MED ORDER — ACETAMINOPHEN 650 MG RE SUPP
650.0000 mg | Freq: Four times a day (QID) | RECTAL | Status: DC | PRN
Start: 2017-01-15 — End: 2017-01-22

## 2017-01-15 MED ORDER — IPRATROPIUM-ALBUTEROL 0.5-2.5 (3) MG/3ML IN SOLN: 3.0000 mL | RESPIRATORY_TRACT | Status: DC

## 2017-01-15 MED ORDER — CITALOPRAM HYDROBROMIDE 20 MG PO TABS
10.0000 mg | ORAL_TABLET | Freq: Every day | ORAL | Status: DC
  Administered 2017-01-15 – 2017-01-22 (×8): 10 mg via ORAL
  Filled 2017-01-15 (×8): qty 1

## 2017-01-15 MED ORDER — MORPHINE SULFATE (CONCENTRATE) 10 MG/0.5ML PO SOLN
2.5000 mg | ORAL | Status: DC | PRN
  Administered 2017-01-17: 5 mg via SUBLINGUAL
  Filled 2017-01-15: qty 1

## 2017-01-15 MED ORDER — MOMETASONE FURO-FORMOTEROL FUM 200-5 MCG/ACT IN AERO
2.0000 | INHALATION_SPRAY | Freq: Two times a day (BID) | RESPIRATORY_TRACT | Status: DC
  Administered 2017-01-15 – 2017-01-22 (×14): 2 via RESPIRATORY_TRACT
  Filled 2017-01-15: qty 8.8

## 2017-01-15 MED ORDER — ONDANSETRON HCL 4 MG PO TABS: 4.0000 mg | ORAL_TABLET | Freq: Four times a day (QID) | ORAL | Status: DC | PRN

## 2017-01-15 MED ORDER — ADULT MULTIVITAMIN W/MINERALS CH
1.0000 | ORAL_TABLET | Freq: Every day | ORAL | Status: DC
  Administered 2017-01-16 – 2017-01-22 (×7): 1 via ORAL
  Filled 2017-01-15 (×8): qty 1

## 2017-01-15 MED ORDER — METHYLPREDNISOLONE SODIUM SUCC 125 MG IJ SOLR
60.0000 mg | Freq: Four times a day (QID) | INTRAMUSCULAR | Status: DC
  Administered 2017-01-15: 60 mg via INTRAVENOUS
  Filled 2017-01-15: qty 2

## 2017-01-15 MED ORDER — ORAL CARE MOUTH RINSE
15.0000 mL | Freq: Two times a day (BID) | OROMUCOSAL | Status: DC
  Administered 2017-01-15 – 2017-01-22 (×7): 15 mL via OROMUCOSAL

## 2017-01-15 MED ORDER — ASPIRIN 81 MG PO CHEW
81.0000 mg | CHEWABLE_TABLET | Freq: Every day | ORAL | Status: DC
  Administered 2017-01-15 – 2017-01-21 (×7): 81 mg via ORAL
  Filled 2017-01-15 (×7): qty 1

## 2017-01-15 MED ORDER — IPRATROPIUM-ALBUTEROL 0.5-2.5 (3) MG/3ML IN SOLN
3.0000 mL | RESPIRATORY_TRACT | Status: DC
  Administered 2017-01-15 – 2017-01-17 (×15): 3 mL via RESPIRATORY_TRACT
  Filled 2017-01-15 (×15): qty 3

## 2017-01-15 MED ORDER — ACETAMINOPHEN 325 MG PO TABS: 650.0000 mg | ORAL_TABLET | Freq: Four times a day (QID) | ORAL | Status: DC | PRN

## 2017-01-15 MED ORDER — IPRATROPIUM-ALBUTEROL 0.5-2.5 (3) MG/3ML IN SOLN: 3.0000 mL | RESPIRATORY_TRACT | Status: DC | PRN

## 2017-01-15 MED ORDER — TEMAZEPAM 15 MG PO CAPS
30.0000 mg | ORAL_CAPSULE | Freq: Every evening | ORAL | Status: DC | PRN
  Administered 2017-01-15 – 2017-01-16 (×2): 30 mg via ORAL
  Filled 2017-01-15 (×2): qty 2

## 2017-01-15 MED ORDER — ENOXAPARIN SODIUM 40 MG/0.4ML ~~LOC~~ SOLN
40.0000 mg | SUBCUTANEOUS | Status: DC
  Administered 2017-01-15 – 2017-01-21 (×7): 40 mg via SUBCUTANEOUS
  Filled 2017-01-15 (×6): qty 0.4

## 2017-01-15 MED ORDER — ONDANSETRON HCL 4 MG/2ML IJ SOLN: 4.0000 mg | Freq: Four times a day (QID) | INTRAMUSCULAR | Status: DC | PRN

## 2017-01-15 MED ORDER — CHLORHEXIDINE GLUCONATE 0.12 % MT SOLN
15.0000 mL | Freq: Two times a day (BID) | OROMUCOSAL | Status: DC
  Administered 2017-01-15 – 2017-01-22 (×12): 15 mL via OROMUCOSAL
  Filled 2017-01-15 (×12): qty 15

## 2017-01-15 MED ORDER — ENSURE ENLIVE PO LIQD
237.0000 mL | Freq: Three times a day (TID) | ORAL | Status: DC
  Administered 2017-01-15 – 2017-01-22 (×20): 237 mL via ORAL

## 2017-01-15 MED ORDER — MONTELUKAST SODIUM 10 MG PO TABS: 10.0000 mg | ORAL_TABLET | Freq: Every day | ORAL | Status: DC

## 2017-01-15 MED ORDER — ALBUTEROL SULFATE (2.5 MG/3ML) 0.083% IN NEBU: 2.5000 mg | INHALATION_SOLUTION | RESPIRATORY_TRACT | Status: DC | PRN

## 2017-01-15 MED ORDER — PRAVASTATIN SODIUM 20 MG PO TABS
20.0000 mg | ORAL_TABLET | Freq: Every day | ORAL | Status: DC
  Administered 2017-01-15 – 2017-01-21 (×7): 20 mg via ORAL
  Filled 2017-01-15 (×7): qty 1

## 2017-01-15 NOTE — Progress Notes (Signed)
Visit made. Patient is currently followed by Hospice and [facility] at home with a diagnosis of COPD and CHF. She is a DNR code. With out of facility DNR in place in her home. Transfer summary received and in place in patient's hospital chart. Patient was sent to the [facility] ED via EMS by family for evaluation of increased shortness of breath. Hospice was not notified prior to transport. She has been admitted for acute on chronic respiratory failure. She required BiPAP on admission, currently on [place], with plans/orders to titrate as tolerated. She is receiving IV steroids and IV antibiotics. Per chart note review IV abt to be stopped if pro calcitonin is negative. WBC 7,6 this morning. Patient seen sitting up in bed, alert and oriented, daughter at bedside. Patient reports feeling "much better". Some dyspnea noted with conversation. Discussed writer's role while hospitalized. Chart notes and labs reviewed. Patient denied pain or discomfort, looking forward to having lunch. Hospital care team all aware made aware of Hospice involvement. Will continue to follow and update hospice team. Thank you. [name] RN, BSN, [facility] [place], hospital Liaison [phone] c

## 2017-01-15 NOTE — H&P (Signed)
[facility]  PATIENT NAME: Brooke Trevino    MR#:  [id number]  DATE OF BIRTH:  1935-04-03  DATE OF ADMISSION:  01/14/2017  PRIMARY CARE PHYSICIAN: Sofie Hartigan, MD   REQUESTING/REFERRING PHYSICIAN: Joni Fears, MD  CHIEF COMPLAINT:   Chief Complaint  Patient presents with  . Respiratory Distress    HISTORY OF PRESENT ILLNESS:  Brooke Trevino  is a 81 y.o. female who presents with states that she began feeling more short of breath over the last couple of days, though grew significantly worse today.  -increased  wheezing.  Now on bipap -  She was treated for likely COPD exacerbation, but also covered with antibiotics.   Patient's respiratory status seems to have improved on BiPAP Patient on minimal oxygen at this time Patient states her shortness of breath is improved Patient states she is thirsty  Patient is alert awake following commands no significant respiratory distress at this time that requires ventilation Vital signs stable  PAST MEDICAL HISTORY:   Past Medical History:  Diagnosis Date  . CAD (coronary artery disease)   . Emphysema lung ([place])   . History of COPD   . HLD (hyperlipidemia)   . HTN (hypertension)     PAST SURGICAL HISTORY:   Past Surgical History:  Procedure Laterality Date  . CHOLECYSTECTOMY    . ear drum     Ear drum replacement    SOCIAL HISTORY:   Social History   Tobacco Use  . Smoking status: Former Smoker    Last attempt to quit: 08/08/2005    Years since quitting: 11.4  . Smokeless tobacco: Never Used  Substance Use Topics  . Alcohol use: No    FAMILY HISTORY:   Family History  Problem Relation Age of Onset  . Breast cancer Neg Hx     DRUG ALLERGIES:  No Known Allergies  MEDICATIONS AT HOME:   Prior to Admission medications   Medication Sig Start Date End Date Taking? Authorizing Provider  aspirin 81 MG chewable tablet Chew 81 mg by mouth at bedtime.   Yes [provider]  calcium carbonate  (TUMS - DOSED IN MG ELEMENTAL CALCIUM) 500 MG chewable tablet Chew 1 tablet by mouth as needed for indigestion or heartburn.   Yes [provider]  carvedilol (COREG) 6.25 MG tablet Take 1 tablet (6.25 mg total) by mouth 2 (two) times daily with a meal. 08/13/15  Yes Theodoro Grist, MD  citalopram (CELEXA) 10 MG tablet Take 10 mg by mouth daily.   Yes [provider]  Fluticasone-Salmeterol (ADVAIR) 250-50 MCG/DOSE AEPB Inhale 1 puff into the lungs 2 (two) times daily.   Yes [provider]  furosemide (LASIX) 40 MG tablet Take 1 tablet (40 mg total) by mouth 2 (two) times daily. 08/13/15  Yes Theodoro Grist, MD  lisinopril (PRINIVIL,ZESTRIL) 10 MG tablet Take 1 tablet (10 mg total) by mouth daily. 12/31/16  Yes Epifanio Lesches, MD  meloxicam (MOBIC) 7.5 MG tablet TAKE 1 TABLET(7.5 MG) BY MOUTH EVERY DAY 07/21/16  Yes [provider]  metoCLOPramide (REGLAN) 10 MG tablet Take 10 mg 3 (three) times daily by mouth.   Yes [provider]  montelukast (SINGULAIR) 10 MG tablet Take 10 mg by mouth at bedtime.   Yes [provider]  Morphine Sulfate (MORPHINE CONCENTRATE) 10 MG/0.5ML SOLN concentrated solution Place 0.13-0.25 mLs (2.6-5 mg total) under the tongue every 2 (two) hours as needed for shortness of breath. 12/31/16  Yes Epifanio Lesches, MD  potassium chloride  SA (K-DUR,KLOR-CON) 20 MEQ tablet Take 1 tablet (20 mEq total) by mouth daily. 08/13/15  Yes Theodoro Grist, MD  pravastatin (PRAVACHOL) 20 MG tablet Take 20 mg by mouth at bedtime.   Yes [provider]  predniSONE (DELTASONE) 20 MG tablet Take 1 tablet (20 mg total) by mouth daily with breakfast. 12/31/16  Yes Epifanio Lesches, MD  temazepam (RESTORIL) 30 MG capsule Take 30 mg by mouth at bedtime as needed for sleep.   Yes [provider]  albuterol (PROVENTIL) (2.5 MG/3ML) 0.083% nebulizer solution Take 2.5 mg by nebulization every 4 (four) hours as needed for  wheezing or shortness of breath.    [provider]  feeding supplement, ENSURE ENLIVE, (ENSURE ENLIVE) LIQD Take 237 mLs by mouth 3 (three) times daily between meals. 12/31/16   Epifanio Lesches, MD  ipratropium-albuterol (DUONEB) 0.5-2.5 (3) MG/3ML SOLN Take 3 mLs by nebulization every 4 (four) hours.    [provider]    REVIEW OF SYSTEMS:  Review of Systems  Constitutional: Negative for chills, fever, malaise/fatigue and weight loss.  HENT: Negative for ear pain, hearing loss and tinnitus.   Eyes: Negative for blurred vision, double vision, pain and redness.  Respiratory: Positive for shortness of breath and wheezing. Negative for cough and hemoptysis.   Cardiovascular: Negative for chest pain, palpitations, orthopnea and leg swelling.  Gastrointestinal: Negative for abdominal pain, constipation, diarrhea, nausea and vomiting.  Genitourinary: Negative for dysuria, frequency and hematuria.  Musculoskeletal: Negative for back pain, joint pain and neck pain.  Skin:       No acne, rash, or lesions  Neurological: Negative for dizziness, tremors, focal weakness and weakness.  Endo/Heme/Allergies: Negative for polydipsia. Does not bruise/bleed easily.  Psychiatric/Behavioral: Negative for depression. The patient is not nervous/anxious and does not have insomnia.      VITAL SIGNS:   Vitals:   01/15/17 0600 01/15/17 0700 01/15/17 0800 01/15/17 0900  BP: 112/65 (!) 91/53 115/69 111/64  Pulse: 77 76 77 86  Resp: (!) 29 19 (!) 21 (!) 24  Temp:   98.4 F (36.9 C)   TempSrc:   Oral   SpO2: 100% 100% 100% 98%  Weight:      Height:       Wt Readings from Last 3 Encounters:  01/15/17 123 lb 0.3 oz (55.8 kg)  12/31/16 110 lb 3 oz (50 kg)  11/03/16 116 lb (52.6 kg)    PHYSICAL EXAMINATION:  Physical Exam  Vitals reviewed. Constitutional: She is oriented to person, place, and time. She appears well-developed and well-nourished. No distress.  HENT:  Head:  Normocephalic and atraumatic.  Mouth/Throat: Oropharynx is clear and moist.  Eyes: Conjunctivae and EOM are normal. Pupils are equal, round, and reactive to light. No scleral icterus.  Neck: Normal range of motion. Neck supple. No JVD present. No thyromegaly present.  Cardiovascular: Normal rate, regular rhythm and intact distal pulses. Exam reveals no gallop and no friction rub.  No murmur heard. Respiratory: She is in respiratory distress. She has wheezes. She has no rales.  GI: Soft. Bowel sounds are normal. She exhibits no distension. There is no tenderness.  Musculoskeletal: Normal range of motion. She exhibits no edema.  No arthritis, no gout  Lymphadenopathy:    She has no cervical adenopathy.  Neurological: She is alert and oriented to person, place, and time. No cranial nerve deficit.  No dysarthria, no aphasia  Skin: Skin is warm and dry. No rash noted. No erythema.  Psychiatric: She has  a normal mood and affect. Her behavior is normal. Judgment and thought content normal.    LABORATORY PANEL:   CBC Recent Labs  Lab 01/15/17 0528  WBC 7.6  HGB 9.2*  HCT 28.3*  PLT 288   ------------------------------------------------------------------------------------------------------------------  Chemistries  Recent Labs  Lab 01/14/17 2255 01/15/17 0528  NA 136 135  K 3.8 4.1  CL 91* 94*  CO2 34* 32  GLUCOSE 115* 133*  BUN 27* 24*  CREATININE 0.58 0.53  CALCIUM 7.9* 8.1*  AST 24  --   ALT 16  --   ALKPHOS 71  --   BILITOT 0.8  --    ------------------------------------------------------------------------------------------------------------------  Cardiac Enzymes Recent Labs  Lab 01/15/17 0528  TROPONINI 0.03*   ------------------------------------------------------------------------------------------------------------------  RADIOLOGY:  Dg Chest Port 1 View  Result Date: 01/14/2017 CLINICAL DATA:  Respiratory distress. EXAM: PORTABLE CHEST 1 VIEW COMPARISON:   Chest radiograph 12/27/2016. FINDINGS: Monitoring leads overlie the patient. Stable enlarged cardiac and mediastinal contours. Small bilateral pleural effusions, left-greater-than-right. Bilateral perihilar interstitial pulmonary opacities. No pneumothorax. Known lung lesions not as well visualized on current exam. IMPRESSION: Cardiomegaly with bilateral interstitial opacities which may represent edema. Infection not excluded. Small left-greater-than-right pleural effusions. Electronically Signed   By: Lovey Newcomer M.D.   On: 01/14/2017 21:05    EKG:   Orders placed or performed during the hospital encounter of 01/14/17  . EKG 12-Lead  . EKG 12-Lead    IMPRESSION AND PLAN:  81 yo white female with acute COPD exacerbation    Acute on chronic respiratory failure with hypoxia (HCC) -continue BiPAP, admit to stepdown, IV Solu-Medrol, antibiotics Active Problems:   COPD exacerbation ([place]) -continue home meds, other treatment as above   Essential hypertension, benign -continue home medications   Chronic systolic CHF (congestive heart failure) (HCC) -  1.  wean BiPAP as tolerated 2.  Oxygen as needed 3.  Steroids and bronchodilator therapy as prescribed 4.  We will remove antibiotic therapy if pro calcitonin is negative    CODE STATUS: DNR Code Status History    Date Active Date Inactive Code Status Order ID Comments User Context   12/25/2016 11:24 12/31/2016 15:12 DNR [id number]  Wilhelmina Mcardle, MD Inpatient   12/19/2016 17:51 12/25/2016 11:24 Full Code [id number]  Fritzi Mandes, MD Inpatient   08/09/2015 16:45 08/13/2015 17:49 Full Code [id number]  Bettey Costa, MD ED    Questions for Most Recent Historical Code Status (Order [id number])    Question Answer Comment   In the event of cardiac or respiratory ARREST Do not call a "code blue"    In the event of cardiac or respiratory ARREST Do not perform Intubation, CPR, defibrillation or ACLS      Alcus Bradly Patricia Pesa, M.D.  [facility]  Medical Director [place] Director [facility] Cardio-Pulmonary Department

## 2017-01-15 NOTE — ED Notes (Signed)
Pt trial on [employer] 4L/min, desat to  SaO2 of 61%, increased respirations and work of breathing. Pt returned to bipap at previous settings.

## 2017-01-15 NOTE — H&P (Signed)
[facility] [place] NAME: Brooke Trevino    MR#:  [id number]  DATE OF BIRTH:  [birth year]  DATE OF ADMISSION:  01/14/2017  PRIMARY CARE PHYSICIAN: Sofie Hartigan, MD   REQUESTING/REFERRING PHYSICIAN: Joni Fears, MD  CHIEF COMPLAINT:   Chief Complaint  Patient presents with  . Respiratory Distress    HISTORY OF PRESENT ILLNESS:  Brooke Trevino  is a 81 y.o. female who presents with states that she began feeling more short of breath over the last couple of days, though grew significantly worse today.  She has had a fair amount of wheezing.  She denies significant cough, fevers or chills, or other infectious symptoms.  Here in the ED she required BiPAP to improve her oxygenation.  Initial workup did not seem to show pneumonia or other infection.  She was treated for likely COPD exacerbation, but also covered with antibiotics.  Hospitalist were called for admission  PAST MEDICAL HISTORY:   Past Medical History:  Diagnosis Date  . CAD (coronary artery disease)   . Emphysema lung ([place])   . History of COPD   . HLD (hyperlipidemia)   . HTN (hypertension)     PAST SURGICAL HISTORY:   Past Surgical History:  Procedure Laterality Date  . CHOLECYSTECTOMY    . ear drum     Ear drum replacement    SOCIAL HISTORY:   Social History   Tobacco Use  . Smoking status: Former Smoker    Last attempt to quit: 08/08/2005    Years since quitting: 11.4  . Smokeless tobacco: Never Used  Substance Use Topics  . Alcohol use: No    FAMILY HISTORY:   Family History  Problem Relation Age of Onset  . Breast cancer Neg Hx     DRUG ALLERGIES:  No Known Allergies  MEDICATIONS AT HOME:   Prior to Admission medications   Medication Sig Start Date End Date Taking? Authorizing Provider  aspirin 81 MG chewable tablet Chew 81 mg by mouth at bedtime.   Yes [provider]  calcium carbonate (TUMS - DOSED IN MG ELEMENTAL  CALCIUM) 500 MG chewable tablet Chew 1 tablet by mouth as needed for indigestion or heartburn.   Yes [provider]  carvedilol (COREG) 6.25 MG tablet Take 1 tablet (6.25 mg total) by mouth 2 (two) times daily with a meal. 08/13/15  Yes Theodoro Grist, MD  citalopram (CELEXA) 10 MG tablet Take 10 mg by mouth daily.   Yes [provider]  Fluticasone-Salmeterol (ADVAIR) 250-50 MCG/DOSE AEPB Inhale 1 puff into the lungs 2 (two) times daily.   Yes [provider]  furosemide (LASIX) 40 MG tablet Take 1 tablet (40 mg total) by mouth 2 (two) times daily. 08/13/15  Yes Theodoro Grist, MD  lisinopril (PRINIVIL,ZESTRIL) 10 MG tablet Take 1 tablet (10 mg total) by mouth daily. 12/31/16  Yes Epifanio Lesches, MD  meloxicam (MOBIC) 7.5 MG tablet TAKE 1 TABLET(7.5 MG) BY MOUTH EVERY DAY 07/21/16  Yes [provider]  metoCLOPramide (REGLAN) 10 MG tablet Take 10 mg 3 (three) times daily by mouth.   Yes [provider]  montelukast (SINGULAIR) 10 MG tablet Take 10 mg by mouth at bedtime.   Yes [provider]  Morphine Sulfate (MORPHINE CONCENTRATE) 10 MG/0.5ML SOLN concentrated solution Place 0.13-0.25 mLs (2.6-5 mg total) under the tongue every 2 (two) hours as needed for shortness of breath. 12/31/16  Yes Epifanio Lesches, MD  potassium chloride SA (  K-DUR,KLOR-CON) 20 MEQ tablet Take 1 tablet (20 mEq total) by mouth daily. 08/13/15  Yes Theodoro Grist, MD  pravastatin (PRAVACHOL) 20 MG tablet Take 20 mg by mouth at bedtime.   Yes [provider]  predniSONE (DELTASONE) 20 MG tablet Take 1 tablet (20 mg total) by mouth daily with breakfast. 12/31/16  Yes Epifanio Lesches, MD  temazepam (RESTORIL) 30 MG capsule Take 30 mg by mouth at bedtime as needed for sleep.   Yes [provider]  albuterol (PROVENTIL) (2.5 MG/3ML) 0.083% nebulizer solution Take 2.5 mg by nebulization every 4 (four) hours as needed for wheezing or shortness of breath.     [provider]  feeding supplement, ENSURE ENLIVE, (ENSURE ENLIVE) LIQD Take 237 mLs by mouth 3 (three) times daily between meals. 12/31/16   Epifanio Lesches, MD  ipratropium-albuterol (DUONEB) 0.5-2.5 (3) MG/3ML SOLN Take 3 mLs by nebulization every 4 (four) hours.    [provider]    REVIEW OF SYSTEMS:  Review of Systems  Constitutional: Negative for chills, fever, malaise/fatigue and weight loss.  HENT: Negative for ear pain, hearing loss and tinnitus.   Eyes: Negative for blurred vision, double vision, pain and redness.  Respiratory: Positive for shortness of breath and wheezing. Negative for cough and hemoptysis.   Cardiovascular: Negative for chest pain, palpitations, orthopnea and leg swelling.  Gastrointestinal: Negative for abdominal pain, constipation, diarrhea, nausea and vomiting.  Genitourinary: Negative for dysuria, frequency and hematuria.  Musculoskeletal: Negative for back pain, joint pain and neck pain.  Skin:       No acne, rash, or lesions  Neurological: Negative for dizziness, tremors, focal weakness and weakness.  Endo/Heme/Allergies: Negative for polydipsia. Does not bruise/bleed easily.  Psychiatric/Behavioral: Negative for depression. The patient is not nervous/anxious and does not have insomnia.      VITAL SIGNS:   Vitals:   01/14/17 2200 01/14/17 2208 01/14/17 2215 01/14/17 2230  BP: 98/60 98/60 102/71 105/66  Pulse:  93  88  Resp: (!) 22 (!) 22 (!) 22 (!) 22  Temp:      TempSrc:      SpO2:  98%  100%  Weight:      Height:       Wt Readings from Last 3 Encounters:  01/14/17 49.9 kg (110 lb)  12/31/16 50 kg (110 lb 3 oz)  11/03/16 52.6 kg (116 lb)    PHYSICAL EXAMINATION:  Physical Exam  Vitals reviewed. Constitutional: She is oriented to person, place, and time. She appears well-developed and well-nourished. No distress.  HENT:  Head: Normocephalic and atraumatic.  Mouth/Throat: Oropharynx is clear and moist.   Eyes: Conjunctivae and EOM are normal. Pupils are equal, round, and reactive to light. No scleral icterus.  Neck: Normal range of motion. Neck supple. No JVD present. No thyromegaly present.  Cardiovascular: Normal rate, regular rhythm and intact distal pulses. Exam reveals no gallop and no friction rub.  No murmur heard. Respiratory: She is in respiratory distress. She has wheezes. She has no rales.  GI: Soft. Bowel sounds are normal. She exhibits no distension. There is no tenderness.  Musculoskeletal: Normal range of motion. She exhibits no edema.  No arthritis, no gout  Lymphadenopathy:    She has no cervical adenopathy.  Neurological: She is alert and oriented to person, place, and time. No cranial nerve deficit.  No dysarthria, no aphasia  Skin: Skin is warm and dry. No rash noted. No erythema.  Psychiatric: She has a normal mood and affect. Her  behavior is normal. Judgment and thought content normal.    LABORATORY PANEL:   CBC Recent Labs  Lab 01/14/17 2255  WBC 9.3  HGB 9.8*  HCT 30.5*  PLT 290   ------------------------------------------------------------------------------------------------------------------  Chemistries  Recent Labs  Lab 01/14/17 2255  NA 136  K 3.8  CL 91*  CO2 34*  GLUCOSE 115*  BUN 27*  CREATININE 0.58  CALCIUM 7.9*  AST 24  ALT 16  ALKPHOS 71  BILITOT 0.8   ------------------------------------------------------------------------------------------------------------------  Cardiac Enzymes Recent Labs  Lab 01/14/17 2255  TROPONINI 0.04*   ------------------------------------------------------------------------------------------------------------------  RADIOLOGY:  Dg Chest Port 1 View  Result Date: 01/14/2017 CLINICAL DATA:  Respiratory distress. EXAM: PORTABLE CHEST 1 VIEW COMPARISON:  Chest radiograph 12/27/2016. FINDINGS: Monitoring leads overlie the patient. Stable enlarged cardiac and mediastinal contours. Small bilateral  pleural effusions, left-greater-than-right. Bilateral perihilar interstitial pulmonary opacities. No pneumothorax. Known lung lesions not as well visualized on current exam. IMPRESSION: Cardiomegaly with bilateral interstitial opacities which may represent edema. Infection not excluded. Small left-greater-than-right pleural effusions. Electronically Signed   By: Lovey Newcomer M.D.   On: 01/14/2017 21:05    EKG:   Orders placed or performed during the hospital encounter of 01/14/17  . EKG 12-Lead  . EKG 12-Lead    IMPRESSION AND PLAN:  Principal Problem:   Acute on chronic respiratory failure with hypoxia (HCC) -continue BiPAP, admit to stepdown, IV Solu-Medrol, antibiotics Active Problems:   COPD exacerbation ([place]) -continue home meds, other treatment as above   Essential hypertension, benign -continue home medications   Chronic systolic CHF (congestive heart failure) ([place]) -CHF does not appear to be an acute part of her respiratory failure, will continue home medications  All the records are reviewed and case discussed with ED provider. Management plans discussed with the patient and/or family.  DVT PROPHYLAXIS: SubQ lovenox  GI PROPHYLAXIS: None  ADMISSION STATUS: Inpatient  CODE STATUS: DNR Code Status History    Date Active Date Inactive Code Status Order ID Comments User Context   12/25/2016 11:24 12/31/2016 15:12 DNR [id number]  Wilhelmina Mcardle, MD Inpatient   12/19/2016 17:51 12/25/2016 11:24 Full Code [id number]  Fritzi Mandes, MD Inpatient   08/09/2015 16:45 08/13/2015 17:49 Full Code [id number]  Bettey Costa, MD ED    Questions for Most Recent Historical Code Status (Order [id number])    Question Answer Comment   In the event of cardiac or respiratory ARREST Do not call a "code blue"    In the event of cardiac or respiratory ARREST Do not perform Intubation, CPR, defibrillation or ACLS    In the event of cardiac or respiratory ARREST Use medication by any route, position, wound  care, and other measures to relive pain and suffering. May use oxygen, suction and manual treatment of airway obstruction as needed for comfort.         Advance Directive Documentation     Most Recent Value  Type of Advance Directive  Out of facility DNR (pink MOST or yellow form)  Pre-existing out of facility DNR order (yellow form or pink MOST form)  No data  "MOST" Form in Place?  No data      TOTAL TIME TAKING CARE OF THIS PATIENT: 45 minutes.   Javontae Marlette Hanover 01/15/2017, 12:15 AM  [facility]  Office  [phone]  CC: Primary care physician; Sofie Hartigan, MD  Note:  This document was prepared using Dragon voice recognition software and may include unintentional dictation errors.

## 2017-01-15 NOTE — ED Notes (Signed)
Dr. Jannifer Franklin advised pt cannot tolerate O2/[place] and is unable to come off bipap for PE study. CT called to inform of same.

## 2017-01-15 NOTE — Progress Notes (Signed)
eLink Physician-Brief Progress Note Patient Name: Cordelia Bessinger DOB: 17-Dec-1935 MRN: [id number]   Date of Service  01/15/2017  HPI/Events of Note  81 yo female with PMH of COPD. AECOPD now requiring BiPAP. PCCM asked to assume care in ICU. Hospitalist feels that patient can be seen in AM. VSS. Looks comfortable on video assessment.   eICU Interventions  Will order: 1. Albuterol 2.5 mg via neb Q 2 hours PRN SOB or wheezing.      Intervention Category Evaluation Type: New Patient Evaluation  Lysle Dingwall 01/15/2017, 2:23 AM

## 2017-01-15 NOTE — Progress Notes (Signed)
Pharmacy Antibiotic Note  Brooke Trevino is a 81 y.o. female admitted on 01/14/2017 with pneumonia.  Pharmacy has been consulted for cefepime dosing.  Plan: Continue cefepime 2g IV Q24hr.   MRSA PCR is negative; vancomycin discontinued during AM rounds.  Height: 5\' 4"  (162.6 cm) Weight: 123 lb 0.3 oz (55.8 kg) IBW/kg (Calculated) : 54.7  Temp (24hrs), Avg:98.5 F (36.9 C), Min:98.4 F (36.9 C), Max:98.6 F (37 C)  Recent Labs  Lab 01/14/17 1943 01/14/17 2255 01/14/17 2258 01/15/17 0528  WBC  --  9.3  --  7.6  CREATININE  --  0.58  --  0.53  LATICACIDVEN 2.2*  --  1.3  --     Estimated Creatinine Clearance: 49.2 mL/min (by C-G formula based on SCr of 0.53 mg/dL).    No Known Allergies  Antimicrobials this admission: Vancomycin 11/4 >> 11/5 Cefepime 11/4 >>   Dose adjustments this admission: N/A  Microbiology results: 11/4 BCx: no growth < 12 hours 11/4 UCx: pending 11/5 MRSA PCR: negative  11/5 Procalcitonin: 2.84  Thank you for allowing pharmacy to be a part of this patient's care.  Simpson,Michael L 01/15/2017 7:04 PM

## 2017-01-15 NOTE — Progress Notes (Signed)
Initial Nutrition Assessment  DOCUMENTATION CODES:   Non-severe (moderate) malnutrition in context of chronic illness  INTERVENTION:  Provide Ensure Enlive po TID, each supplement provides 350 kcal and 20 grams of protein.  Provide multivitamin with minerals daily.  NUTRITION DIAGNOSIS:   Moderate Malnutrition related to chronic illness(COPD, emphysema, lung cancer) as evidenced by moderate fat depletion, moderate muscle depletion.  GOAL:   Patient will meet greater than or equal to 90% of their needs  MONITOR:   PO intake, Supplement acceptance, Labs, Weight trends, Skin, I & O's  REASON FOR ASSESSMENT:   Rounds    ASSESSMENT:   81 year old female with PMHx of COPD, emphysema of lung, HTN, CAD, HLD , cardiomyopathy with EF 25%, presumed non-small cell lung cancer s/p XRT, hx AAA who presented with SOB and admitted for acute exacerbation of COPD and was placed on BiPAP. Patient was being followed by hospice at home.   Met with patient at bedside. She was on HFNC. Patient known to this RD from previous admission a few weeks ago. She reports that her appetite is fair. When she discharged home she was eating well at first. She was able to eat parts of 2 meals and was drinking 3-4 Ensure daily (unsure which type of Ensure). She reports that 3-4 days ago her appetite decreased. She has some difficulty chewing but does not want food chopped.   Patient reports her recent UBW was 117 lbs. Weight has been variable in chart.  Medications reviewed and include: methylprednisolone 20 mg Q12hrs, cefepime.  Labs reviewed: Chloride 94, BUN 24, elevated Troponin.  Discussed with RN.  NUTRITION - FOCUSED PHYSICAL EXAM:    Most Recent Value  Orbital Region  Moderate depletion  Upper Arm Region  Severe depletion  Thoracic and Lumbar Region  Moderate depletion  Buccal Region  Moderate depletion  Temple Region  Moderate depletion  Clavicle Bone Region  Moderate depletion  Clavicle and  Acromion Bone Region  Moderate depletion  Scapular Bone Region  Moderate depletion  Dorsal Hand  Moderate depletion  Patellar Region  Moderate depletion  Anterior Thigh Region  Moderate depletion  Posterior Calf Region  Moderate depletion  Edema (RD Assessment)  None  Hair  Reviewed  Eyes  Reviewed  Mouth  Reviewed  Skin  Reviewed  Nails  Reviewed     Diet Order:  Diet regular Room service appropriate? Yes; Fluid consistency: Thin  EDUCATION NEEDS:   No education needs have been identified at this time  Skin:  Skin Assessment: Skin Integrity Issues: Skin Integrity Issues:: Other (Comment) Other: MSAD to buttocks  Last BM:  Unknown  Height:   Ht Readings from Last 1 Encounters:  01/15/17 _0  (1.626 m)    Weight:   Wt Readings from Last 1 Encounters:  01/15/17 123 lb 0.3 oz (55.8 kg)    Ideal Body Weight:  54.5 kg  BMI:  Body mass index is 21.12 kg/m.  Estimated Nutritional Needs:   Kcal:  9728-2060 (25-30 kcal/kg)  Protein:  70-85 grams (1.2-1.5 grams/kg)  Fluid:  1.4 L/day (25 mL/kg)  Willey Blade, MS, RD, LDN Office: [phone] Pager: [phone] After Hours/Weekend Pager: [phone]

## 2017-01-16 LAB — BASIC METABOLIC PANEL
Anion gap: 7 (ref 5–15)
BUN: 26 mg/dL — AB (ref 6–20)
CHLORIDE: 95 mmol/L — AB (ref 101–111)
CO2: 36 mmol/L — ABNORMAL HIGH (ref 22–32)
Calcium: 8.7 mg/dL — ABNORMAL LOW (ref 8.9–10.3)
Creatinine, Ser: 0.59 mg/dL (ref 0.44–1.00)
GFR calc Af Amer: >60 mL/min (ref >60)
GFR calc non Af Amer: >60 mL/min (ref >60)
GLUCOSE: 118 mg/dL — AB (ref 65–99)
POTASSIUM: 4 mmol/L (ref 3.5–5.1)
SODIUM: 138 mmol/L (ref 135–145)

## 2017-01-16 LAB — CBC
HCT: 28.1 % — ABNORMAL LOW (ref 35.0–47.0)
Hemoglobin: 9.2 g/dL — ABNORMAL LOW (ref 12.0–16.0)
MCH: 30.2 pg (ref 26.0–34.0)
MCHC: 32.8 g/dL (ref 32.0–36.0)
MCV: 92 fL (ref 80.0–100.0)
Platelets: 332 10*3/uL (ref 150–440)
RBC: 3.05 MIL/uL — ABNORMAL LOW (ref 3.80–5.20)
RDW: 15.5 % — AB (ref 11.5–14.5)
WBC: 7.6 10*3/uL (ref 3.6–11.0)

## 2017-01-16 LAB — URINE CULTURE: CULTURE: NO GROWTH

## 2017-01-16 MED ORDER — DEXTROSE 5 % IV SOLN
500.0000 mg | INTRAVENOUS | Status: DC
  Administered 2017-01-16 – 2017-01-18 (×3): 500 mg via INTRAVENOUS
  Filled 2017-01-16 (×4): qty 500

## 2017-01-16 NOTE — Progress Notes (Signed)
Pharmacy Antibiotic Note  Brooke Trevino is a 81 y.o. female admitted on 01/14/2017 with pneumonia.  Pharmacy has been consulted for cefepime dosing.  Plan: Continue cefepime 2g IV Q24hr.  Will initiate Azithromycin 500mg  IV Q24H x 5 days. Pharmacy will continue to follow and adjust as needed.   MRSA PCR is negative; vancomycin discontinued during AM rounds.  Height: 5\' 4"  (162.6 cm) Weight: 123 lb 0.3 oz (55.8 kg) IBW/kg (Calculated) : 54.7  Temp (24hrs), Avg:98.5 F (36.9 C), Min:98 F (36.7 C), Max:98.8 F (37.1 C)  Recent Labs  Lab 01/14/17 1943 01/14/17 2255 01/14/17 2258 01/15/17 0528 01/16/17 0617  WBC  --  9.3  --  7.6 7.6  CREATININE  --  0.58  --  0.53 0.59  LATICACIDVEN 2.2*  --  1.3  --   --     Estimated Creatinine Clearance: 49.2 mL/min (by C-G formula based on SCr of 0.59 mg/dL).    No Known Allergies  Antimicrobials this admission: Vancomycin 11/4 >> 11/5 Cefepime 11/4 >> Azithromycin 11/6 >>   Dose adjustments this admission: N/A  Microbiology results: 11/4 BCx: no growth D2 11/4 UCx: no growth D2 11/5 MRSA PCR: negative  11/5 Procalcitonin: 2.84  Thank you for allowing pharmacy to be a part of this patient's care.  Lendon Ka, PharmD Pharmacy Resident 01/16/2017 12:16 PM

## 2017-01-16 NOTE — Progress Notes (Signed)
Visit made. Patient seen lying in bed, remains on [place] oxygen. Patient alert and interactive, decreased oxygen saturations/increased work of breathing noted with conversation. Patient reports that she feels "much better" and that her breathing has improved. She remains on 2 IV antibiotics as well as IV steroids. Using Bipap intermittently. No family at bedside, patient reports he husband has been in the room most of the day. Emotional support provided. Will continue to follow and update hospice team. [name] RN, BSN, [facility] [place], hospital Liaison [phone] c

## 2017-01-16 NOTE — H&P (Signed)
[facility]  PATIENT NAME: Brooke Trevino    MR#:  [id number]  DATE OF BIRTH:  20-May-1935  DATE OF ADMISSION:  01/14/2017  PRIMARY CARE PHYSICIAN: Sofie Hartigan, MD   REQUESTING/REFERRING PHYSICIAN: Joni Fears, MD  CHIEF COMPLAINT:   Chief Complaint  Patient presents with  . Respiratory Distress    HISTORY OF PRESENT ILLNESS:  Resting comfortably on high flow nasal cannula FiO2 at 45% Patient does not seem in any acute respiratory distress Patient sleeping at this time   REVIEW OF SYSTEMS:  Review of Systems  All other systems reviewed and are negative.    VITAL SIGNS:   Vitals:   01/16/17 0400 01/16/17 0500 01/16/17 0605 01/16/17 0700  BP: 140/79 138/74 140/79 (!) 142/83  Pulse: 78 77 83 74  Resp: 19 (!) 24 (!) 26 (!) 21  Temp:    98 F (36.7 C)  TempSrc:      SpO2: 95% 94% 93% 95%  Weight:      Height:       Wt Readings from Last 3 Encounters:  01/15/17 123 lb 0.3 oz (55.8 kg)  12/31/16 110 lb 3 oz (50 kg)  11/03/16 116 lb (52.6 kg)    PHYSICAL EXAMINATION:  Physical Exam  Vitals reviewed. Constitutional: She appears well-developed and well-nourished. No distress.  HENT:  Head: Normocephalic and atraumatic.  Mouth/Throat: Oropharynx is clear and moist.  Eyes: Conjunctivae and EOM are normal. Pupils are equal, round, and reactive to light. No scleral icterus.  Neck: Normal range of motion. Neck supple. No tracheal deviation present.  Cardiovascular: Normal rate, regular rhythm and intact distal pulses. Exam reveals no gallop and no friction rub.  No murmur heard. Respiratory: No respiratory distress. She has no wheezes. She has no rales.  GI: Soft. Bowel sounds are normal.  Musculoskeletal: Normal range of motion. She exhibits no edema.  Neurological: She is alert. No cranial nerve deficit.  Skin: Skin is warm and dry. No rash noted. No erythema.  Psychiatric: She has a normal mood and affect. Her behavior is normal. Judgment and thought  content normal.    LABORATORY PANEL:   CBC Recent Labs  Lab 01/16/17 0617  WBC 7.6  HGB 9.2*  HCT 28.1*  PLT 332   ------------------------------------------------------------------------------------------------------------------  Chemistries  Recent Labs  Lab 01/14/17 2255  01/16/17 0617  NA 136   < > 138  K 3.8   < > 4.0  CL 91*   < > 95*  CO2 34*   < > 36*  GLUCOSE 115*   < > 118*  BUN 27*   < > 26*  CREATININE 0.58   < > 0.59  CALCIUM 7.9*   < > 8.7*  AST 24  --   --   ALT 16  --   --   ALKPHOS 71  --   --   BILITOT 0.8  --   --    < > = values in this interval not displayed.   IMPRESSION AND PLAN:  81 yo white female with acute COPD exacerbation    Acute on chronic respiratory failure with hypoxia (HCC) -continue BiPAP, admit to stepdown, IV Solu-Medrol, antibiotics Active Problems:   COPD exacerbation ([place]) -continue home meds, other treatment as above   Essential hypertension, benign -continue home medications   Chronic systolic CHF (congestive heart failure) (HCC) -  1.  wean BiPAP as tolerated 2.  Oxygen as needed 3.  Steroids and bronchodilator therapy as prescribed 4.  Continue  IV antibiotics Pro calcitonin level is elevated   CODE STATUS: DNR Code Status History    Date Active Date Inactive Code Status Order ID Comments User Context   12/25/2016 11:24 12/31/2016 15:12 DNR [id number]  Wilhelmina Mcardle, MD Inpatient   12/19/2016 17:51 12/25/2016 11:24 Full Code [id number]  Fritzi Mandes, MD Inpatient   08/09/2015 16:45 08/13/2015 17:49 Full Code [id number]  Bettey Costa, MD ED    Questions for Most Recent Historical Code Status (Order [id number])    Question Answer Comment   In the event of cardiac or respiratory ARREST Do not call a "code blue"    In the event of cardiac or respiratory ARREST Do not perform Intubation, CPR, defibrillation or ACLS      Antonius Hartlage Patricia Pesa, M.D.  [facility]  Medical Director [place] Director [facility] Cardio-Pulmonary Department

## 2017-01-16 NOTE — Progress Notes (Signed)
[place] at [place] NAME: Brooke Trevino    MR#:  [id number]  DATE OF BIRTH:  11-21-1935  SUBJECTIVE:  CHIEF COMPLAINT:   Chief Complaint  Patient presents with  . Respiratory Distress   Contnues to be short of breath.  On high flow nasal cannula.  feels weak.  REVIEW OF SYSTEMS:    Review of Systems  Constitutional: Positive for malaise/fatigue. Negative for chills and fever.  HENT: Negative for sore throat.   Eyes: Negative for blurred vision, double vision and pain.  Respiratory: Positive for cough and shortness of breath. Negative for hemoptysis.   Cardiovascular: Negative for chest pain, palpitations, orthopnea and leg swelling.  Gastrointestinal: Negative for abdominal pain, constipation, diarrhea, heartburn, nausea and vomiting.  Genitourinary: Negative for dysuria and hematuria.  Musculoskeletal: Negative for back pain and joint pain.  Skin: Negative for rash.  Neurological: Positive for weakness. Negative for sensory change, speech change, focal weakness and headaches.  Endo/Heme/Allergies: Does not bruise/bleed easily.  Psychiatric/Behavioral: Negative for depression. The patient is not nervous/anxious.     DRUG ALLERGIES:  No Known Allergies  VITALS:  Blood pressure (!) 142/83, pulse 74, temperature 98 F (36.7 C), resp. rate (!) 21, height 5\' 4"  (1.626 m), weight 55.8 kg (123 lb 0.3 oz), SpO2 95 %.  PHYSICAL EXAMINATION:   Physical Exam  GENERAL:  81 y.o.-year-old patient lying in the bed on HFNC EYES: Pupils equal, round, reactive to light and accommodation. No scleral icterus. Extraocular muscles intact.  HEENT: Head atraumatic, normocephalic. Oropharynx and nasopharynx clear.  NECK:  Supple, no jugular venous distention. No thyroid enlargement, no tenderness.  LUNGS: B/L crackles CARDIOVASCULAR: S1, S2 normal. No murmurs, rubs, or gallops.  ABDOMEN: Soft, nontender, nondistended. Bowel sounds present. No  organomegaly or mass.  EXTREMITIES: No cyanosis, clubbing or edema b/l.    NEUROLOGIC: Cranial nerves II through XII are intact. No focal Motor or sensory deficits b/l.   PSYCHIATRIC: The patient is alert and oriented x 3.  SKIN: No obvious rash, lesion, or ulcer.   LABORATORY PANEL:   CBC Recent Labs  Lab 01/16/17 0617  WBC 7.6  HGB 9.2*  HCT 28.1*  PLT 332   ------------------------------------------------------------------------------------------------------------------ Chemistries  Recent Labs  Lab 01/14/17 2255  01/16/17 0617  NA 136   < > 138  K 3.8   < > 4.0  CL 91*   < > 95*  CO2 34*   < > 36*  GLUCOSE 115*   < > 118*  BUN 27*   < > 26*  CREATININE 0.58   < > 0.59  CALCIUM 7.9*   < > 8.7*  AST 24  --   --   ALT 16  --   --   ALKPHOS 71  --   --   BILITOT 0.8  --   --    < > = values in this interval not displayed.   ------------------------------------------------------------------------------------------------------------------  Cardiac Enzymes Recent Labs  Lab 01/15/17 1040  TROPONINI 0.04*   ------------------------------------------------------------------------------------------------------------------  RADIOLOGY:  Dg Chest Port 1 View  Result Date: 01/14/2017 CLINICAL DATA:  Respiratory distress. EXAM: PORTABLE CHEST 1 VIEW COMPARISON:  Chest radiograph 12/27/2016. FINDINGS: Monitoring leads overlie the patient. Stable enlarged cardiac and mediastinal contours. Small bilateral pleural effusions, left-greater-than-right. Bilateral perihilar interstitial pulmonary opacities. No pneumothorax. Known lung lesions not as well visualized on current exam. IMPRESSION: Cardiomegaly with bilateral interstitial opacities which may represent edema. Infection not excluded. Small left-greater-than-right pleural  effusions. Electronically Signed   By: Lovey Newcomer M.D.   On: 01/14/2017 21:05     ASSESSMENT AND PLAN:    * Acute on chronic hypoxic respiratory  failure secondary to COPD exacerbation and acute on chronic systolic congestive heart failure On HFNC IV steroids, abx Discussed with Dr. Mortimer Fries  * Acute on chronic systolic congestive heart failure -Patient has known EF of 20-25% by echo of January 2017 -Continue lisinopril, Coreg, IV Lasix monitor I's and O's and daily weights  * Known multiple lung lesions being treated empirically for presumed non-small cell lung cancer -Patient finished radiation therapy for the right lung at the cancer center -She is being worked up by Dr. Raul Del for questionable cavitary lesion on the left side -Patient completed multiple courses of antibiotics with Ceftin Avelox Levaquin recently over the last several Weeks.  * DVT prophylaxis subcutaneous Lovenox    All the records are reviewed and case discussed with Care Management/Social Worker Management plans discussed with the patient, family and they are in agreement.  CODE STATUS: DNR  DVT Prophylaxis: SCDs  TOTAL TIME TAKING CARE OF THIS PATIENT: 30 minutes.   Hillary Bow R M.D on 01/16/2017 at 1:09 PM  Between 7am to 6pm - Pager - [phone]  After 6pm go to [email or web address] - password [email or web address] [facility]  Office  [phone]  CC: Primary care physician; Sofie Hartigan, MD  Note: This dictation was prepared with Dragon dictation along with smaller phrase technology. Any transcriptional errors that result from this process are unintentional.

## 2017-01-16 NOTE — Progress Notes (Signed)
Per Maggie PA push oral fluids. Will encourage patients oral intake.

## 2017-01-16 NOTE — Progress Notes (Signed)
Spoke with [facility] PA regarding patients urine output of 200 ml since 7:00am. She will review. At this time no new orders given.

## 2017-01-17 MED ORDER — POTASSIUM CHLORIDE CRYS ER 20 MEQ PO TBCR: 20.0000 meq | EXTENDED_RELEASE_TABLET | Freq: Once | ORAL | Status: DC

## 2017-01-17 MED ORDER — TEMAZEPAM 15 MG PO CAPS
30.0000 mg | ORAL_CAPSULE | Freq: Every day | ORAL | Status: DC
  Administered 2017-01-17 – 2017-01-21 (×5): 30 mg via ORAL
  Filled 2017-01-17 (×5): qty 2

## 2017-01-17 MED ORDER — FUROSEMIDE 10 MG/ML IJ SOLN: 40.0000 mg | Freq: Once | INTRAMUSCULAR | Status: DC

## 2017-01-17 MED ORDER — FUROSEMIDE 10 MG/ML IJ SOLN
40.0000 mg | Freq: Once | INTRAMUSCULAR | Status: AC
  Administered 2017-01-17: 40 mg via INTRAVENOUS
  Filled 2017-01-17: qty 4

## 2017-01-17 MED ORDER — POTASSIUM CHLORIDE CRYS ER 20 MEQ PO TBCR
20.0000 meq | EXTENDED_RELEASE_TABLET | Freq: Once | ORAL | Status: AC
  Administered 2017-01-17: 20 meq via ORAL
  Filled 2017-01-17: qty 1

## 2017-01-17 MED ORDER — BUDESONIDE 0.5 MG/2ML IN SUSP
0.5000 mg | Freq: Two times a day (BID) | RESPIRATORY_TRACT | Status: DC
  Administered 2017-01-17 – 2017-01-22 (×10): 0.5 mg via RESPIRATORY_TRACT
  Filled 2017-01-17 (×10): qty 2

## 2017-01-17 MED ORDER — FUROSEMIDE 10 MG/ML IJ SOLN
60.0000 mg | Freq: Once | INTRAMUSCULAR | Status: AC
  Administered 2017-01-17: 60 mg via INTRAVENOUS
  Filled 2017-01-17: qty 6

## 2017-01-17 MED ORDER — IPRATROPIUM-ALBUTEROL 0.5-2.5 (3) MG/3ML IN SOLN
3.0000 mL | Freq: Four times a day (QID) | RESPIRATORY_TRACT | Status: DC
  Administered 2017-01-17 – 2017-01-18 (×5): 3 mL via RESPIRATORY_TRACT
  Filled 2017-01-17 (×5): qty 3

## 2017-01-17 NOTE — Progress Notes (Signed)
Pt remains alert and oriented, resting comfortably, has slept most of the shift.  NSR, lung sounds range from clear to rhonchi.  Pt has remained on HFNC, FiO2 increased to the 50's while pt sleeping soundly (would Desat to low 80's while soundly sleeping).  Pt had one small BM (smear), in which she was incontinent.  Foley remains in place due to urinary retention, 825 ml urine output from foley this shift.  Vital signs stable, afebrile.

## 2017-01-17 NOTE — H&P (Signed)
[name]  PATIENT NAME: Brooke Trevino    MR#:  [id number]  DATE OF BIRTH:  February 26, 1936  DATE OF ADMISSION:  01/14/2017  PRIMARY CARE PHYSICIAN: Sofie Hartigan, MD   REQUESTING/REFERRING PHYSICIAN: Joni Fears, MD  CHIEF COMPLAINT:   Chief Complaint  Patient presents with  . Respiratory Distress    HISTORY OF PRESENT ILLNESS:   Resting comfortably on high flow nasal cannula FiO2 at 45% Patient does not seem in any acute respiratory distress Patient sleeping at this time  REVIEW OF SYSTEMS:  Review of Systems  All other systems reviewed and are negative.    VITAL SIGNS:   Vitals:   01/17/17 0430 01/17/17 0500 01/17/17 0600 01/17/17 0749  BP:  (!) 150/89 (!) 157/85   Pulse: 80 87 78   Resp: (!) 26 (!) 34 (!) 28   Temp: 98.4 F (36.9 C)   98.5 F (36.9 C)  TempSrc: Oral   Oral  SpO2: 95% 93% 94%   Weight:      Height:       Wt Readings from Last 3 Encounters:  01/15/17 123 lb 0.3 oz (55.8 kg)  12/31/16 110 lb 3 oz (50 kg)  11/03/16 116 lb (52.6 kg)    PHYSICAL EXAMINATION:  Physical Exam  Vitals reviewed. Constitutional: She appears well-developed and well-nourished. No distress.  HENT:  Head: Normocephalic and atraumatic.  Mouth/Throat: Oropharynx is clear and moist.  Eyes: Conjunctivae and EOM are normal. Pupils are equal, round, and reactive to light. No scleral icterus.  Neck: Normal range of motion. Neck supple. No tracheal deviation present.  Cardiovascular: Normal rate, regular rhythm and intact distal pulses. Exam reveals no gallop and no friction rub.  No murmur heard. Respiratory: No respiratory distress. She has no wheezes. She has no rales.  GI: Soft. Bowel sounds are normal.  Musculoskeletal: Normal range of motion. She exhibits no edema.  Neurological: She is alert. No cranial nerve deficit.  Skin: Skin is warm and dry. No rash noted. No erythema.  Psychiatric: She has a normal mood and affect. Her behavior is normal. Judgment and  thought content normal.    LABORATORY PANEL:   CBC Recent Labs  Lab 01/16/17 0617  WBC 7.6  HGB 9.2*  HCT 28.1*  PLT 332   ------------------------------------------------------------------------------------------------------------------  Chemistries  Recent Labs  Lab 01/14/17 2255  01/16/17 0617  NA 136   < > 138  K 3.8   < > 4.0  CL 91*   < > 95*  CO2 34*   < > 36*  GLUCOSE 115*   < > 118*  BUN 27*   < > 26*  CREATININE 0.58   < > 0.59  CALCIUM 7.9*   < > 8.7*  AST 24  --   --   ALT 16  --   --   ALKPHOS 71  --   --   BILITOT 0.8  --   --    < > = values in this interval not displayed.   IMPRESSION AND PLAN:   81 yo white female with acute COPD exacerbation    Acute on chronic respiratory failure with hypoxia (HCC) -continue BiPAP, admit to stepdown, IV Solu-Medrol, antibiotics Active Problems:   COPD exacerbation ([place]) -continue home meds, other treatment as above   Essential hypertension, benign -continue home medications   Chronic systolic CHF (congestive heart failure) (HCC) -  1.  wean BiPAP as tolerated 2.  Oxygen as needed 3.  Steroids and bronchodilator therapy  as prescribed 4.  Continue IV antibiotics Pro calcitonin level is elevated  CODE STATUS: DNR   Corrin Parker, M.D.  [facility]  Medical Director [place] Director [facility] Cardio-Pulmonary Department

## 2017-01-17 NOTE — Progress Notes (Signed)
Pharmacy Antibiotic Note  Brooke Trevino is a 81 y.o. female admitted on 01/14/2017 with pneumonia and acute respiratory failure.  Pharmacy has been consulted for cefepime dosing. Patient was also started on azithromycin for atypical coverage.   Plan: Continue Cefepime 2g IV Q24hr.  Continue Azithromycin 500mg  IV Q24H x 5 days.  Pharmacy will continue to follow and adjust as needed.   MRSA PCR is negative; vancomycin discontinued during AM rounds.  Height: 5\' 4"  (162.6 cm) Weight: 123 lb 0.3 oz (55.8 kg) IBW/kg (Calculated) : 54.7  Temp (24hrs), Avg:98.6 F (37 C), Min:98.4 F (36.9 C), Max:99.1 F (37.3 C)  Recent Labs  Lab 01/14/17 1943 01/14/17 2255 01/14/17 2258 01/15/17 0528 01/16/17 0617  WBC  --  9.3  --  7.6 7.6  CREATININE  --  0.58  --  0.53 0.59  LATICACIDVEN 2.2*  --  1.3  --   --     Estimated Creatinine Clearance: 49.2 mL/min (by C-G formula based on SCr of 0.59 mg/dL).    No Known Allergies  Antimicrobials this admission: Vancomycin 11/4 >> 11/5 Cefepime 11/4 >> Azithromycin 11/6 >>   Dose adjustments this admission: N/A  Microbiology results: 11/4 BCx: no growth D3 11/4 UCx: no growth D3 11/5 MRSA PCR: negative  Procalcitonin: 1.55 >> 2.84   Thank you for allowing pharmacy to be a part of this patient's care.  Lendon Ka, PharmD Pharmacy Resident 01/17/2017 2:32 PM

## 2017-01-17 NOTE — Progress Notes (Signed)
[facility] [place] NAME: Brooke Trevino    MR#:  [id number]  DATE OF BIRTH:  January 20, 1936  SUBJECTIVE:  CHIEF COMPLAINT:   Chief Complaint  Patient presents with  . Respiratory Distress   On HFNC 50%. Still has SOB. Awake and alert  REVIEW OF SYSTEMS:    Review of Systems  Constitutional: Positive for malaise/fatigue. Negative for chills and fever.  HENT: Negative for sore throat.   Eyes: Negative for blurred vision, double vision and pain.  Respiratory: Positive for cough and shortness of breath. Negative for hemoptysis.   Cardiovascular: Negative for chest pain, palpitations, orthopnea and leg swelling.  Gastrointestinal: Negative for abdominal pain, constipation, diarrhea, heartburn, nausea and vomiting.  Genitourinary: Negative for dysuria and hematuria.  Musculoskeletal: Negative for back pain and joint pain.  Skin: Negative for rash.  Neurological: Positive for weakness. Negative for sensory change, speech change, focal weakness and headaches.  Endo/Heme/Allergies: Does not bruise/bleed easily.  Psychiatric/Behavioral: Negative for depression. The patient is not nervous/anxious.     DRUG ALLERGIES:  No Known Allergies  VITALS:  Blood pressure 131/77, pulse 96, temperature 98.6 F (37 C), temperature source Oral, resp. rate (!) 30, height 5\' 4"  (1.626 m), weight 55.8 kg (123 lb 0.3 oz), SpO2 92 %.  PHYSICAL EXAMINATION:   Physical Exam  GENERAL:  81 y.o.-year-old patient lying in the bed on HFNC EYES: Pupils equal, round, reactive to light and accommodation. No scleral icterus. Extraocular muscles intact.  HEENT: Head atraumatic, normocephalic. Oropharynx and nasopharynx clear.  NECK:  Supple, no jugular venous distention. No thyroid enlargement, no tenderness.  LUNGS: B/L crackles CARDIOVASCULAR: S1, S2 normal. No murmurs, rubs, or gallops.  ABDOMEN: Soft, nontender, nondistended. Bowel sounds present. No organomegaly  or mass.  EXTREMITIES: No cyanosis, clubbing or edema b/l.    NEUROLOGIC: Cranial nerves II through XII are intact. No focal Motor or sensory deficits b/l.   PSYCHIATRIC: The patient is alert and oriented x 3.  SKIN: No obvious rash, lesion, or ulcer.   LABORATORY PANEL:   CBC Recent Labs  Lab 01/16/17 0617  WBC 7.6  HGB 9.2*  HCT 28.1*  PLT 332   ------------------------------------------------------------------------------------------------------------------ Chemistries  Recent Labs  Lab 01/14/17 2255  01/16/17 0617  NA 136   < > 138  K 3.8   < > 4.0  CL 91*   < > 95*  CO2 34*   < > 36*  GLUCOSE 115*   < > 118*  BUN 27*   < > 26*  CREATININE 0.58   < > 0.59  CALCIUM 7.9*   < > 8.7*  AST 24  --   --   ALT 16  --   --   ALKPHOS 71  --   --   BILITOT 0.8  --   --    < > = values in this interval not displayed.   ------------------------------------------------------------------------------------------------------------------  Cardiac Enzymes Recent Labs  Lab 01/15/17 1040  TROPONINI 0.04*   ------------------------------------------------------------------------------------------------------------------  RADIOLOGY:  No results found.   ASSESSMENT AND PLAN:    * Acute on chronic hypoxic respiratory failure secondary to COPD exacerbation and acute on chronic systolic congestive heart failure On HFNC 50% IV steroids, abx  * Acute on chronic systolic congestive heart failure -Patient has known EF of 20-25% by echo of January 2017 -Continue lisinopril, Coreg IV lasix ordered for today  * Known multiple lung lesions being treated empirically for presumed non-small cell lung  cancer -Patient finished radiation therapy for the right lung at the cancer center -She is being worked up by Dr. Raul Del for questionable cavitary lesion on the left side -Patient completed multiple courses of antibiotics with Ceftin Avelox Levaquin recently over the last several  Weeks.  * DVT prophylaxis subcutaneous Lovenox  Very poor long term prognosis  CODE STATUS: DNR  DVT Prophylaxis: SCDs  TOTAL TIME TAKING CARE OF THIS PATIENT: 30 minutes.   Hillary Bow R M.D on 01/17/2017 at 3:22 PM  Between 7am to 6pm - Pager - [phone]  After 6pm go to [email or web address] - password [email or web address] [place] [facility]  Office  [phone]  CC: Primary care physician; Sofie Hartigan, MD  Note: This dictation was prepared with Dragon dictation along with smaller phrase technology. Any transcriptional errors that result from this process are unintentional.

## 2017-01-17 NOTE — Progress Notes (Signed)
Visit made. Patient seen lying in bed, alert, husband at bedside. Patient appeared to have increased work of breathing, discussed use of liquid morphine, she agreed to taking a dose. Staff RN [place] made aware and will administer. Critical care NP Hinton Dyer and CCM MD Dr. Jamal Collin in during visit. Patient remains on Hi Flo oxygen and IV antibiotics. Will continue to follow and update hospice team.  [name] RN, BSN, [facility] and [facility] [place], hospital Liaison [phone] c

## 2017-01-18 ENCOUNTER — Inpatient Hospital Stay

## 2017-01-18 LAB — BASIC METABOLIC PANEL
ANION GAP: 11 (ref 5–15)
BUN: 28 mg/dL — ABNORMAL HIGH (ref 6–20)
CALCIUM: 8.5 mg/dL — AB (ref 8.9–10.3)
CO2: 34 mmol/L — ABNORMAL HIGH (ref 22–32)
CREATININE: 0.39 mg/dL — AB (ref 0.44–1.00)
Chloride: 96 mmol/L — ABNORMAL LOW (ref 101–111)
Glucose, Bld: 122 mg/dL — ABNORMAL HIGH (ref 65–99)
Potassium: 3.8 mmol/L (ref 3.5–5.1)
SODIUM: 141 mmol/L (ref 135–145)

## 2017-01-18 MED ORDER — IPRATROPIUM-ALBUTEROL 0.5-2.5 (3) MG/3ML IN SOLN
3.0000 mL | Freq: Three times a day (TID) | RESPIRATORY_TRACT | Status: DC
  Administered 2017-01-19 – 2017-01-22 (×11): 3 mL via RESPIRATORY_TRACT
  Filled 2017-01-18 (×11): qty 3

## 2017-01-18 MED ORDER — PREDNISONE 20 MG PO TABS
20.0000 mg | ORAL_TABLET | Freq: Every day | ORAL | Status: DC
Start: 2017-01-19 — End: 2017-01-22
  Administered 2017-01-19 – 2017-01-22 (×4): 20 mg via ORAL
  Filled 2017-01-18 (×4): qty 1

## 2017-01-18 MED ORDER — DIPHENOXYLATE-ATROPINE 2.5-0.025 MG PO TABS
1.0000 | ORAL_TABLET | Freq: Four times a day (QID) | ORAL | Status: DC | PRN
Start: 2017-01-18 — End: 2017-01-22

## 2017-01-18 MED ORDER — MORPHINE SULFATE (CONCENTRATE) 10 MG/0.5ML PO SOLN
5.0000 mg | ORAL | Status: DC | PRN
  Administered 2017-01-18 – 2017-01-20 (×7): 5 mg via SUBLINGUAL
  Administered 2017-01-21 – 2017-01-22 (×4): 10 mg via SUBLINGUAL
  Filled 2017-01-18 (×11): qty 1

## 2017-01-18 NOTE — Progress Notes (Signed)
Pharmacy Antibiotic Note  Brooke Trevino is a 81 y.o. female admitted on 01/14/2017 with pneumonia and acute respiratory failure.  Pharmacy has been consulted for cefepime dosing. Patient was also started on azithromycin for atypical coverage.   Plan: Continue Cefepime 2g IV Q24hr for a total of 8 days.  Continue Azithromycin 500mg  IV Q24H x 5 days.  Pharmacy will continue to follow and adjust as needed.   MRSA PCR is negative; vancomycin discontinued during AM rounds.  Height: 5\' 4"  (162.6 cm) Weight: 123 lb 0.3 oz (55.8 kg) IBW/kg (Calculated) : 54.7  Temp (24hrs), Avg:98.5 F (36.9 C), Min:98.4 F (36.9 C), Max:98.7 F (37.1 C)  Recent Labs  Lab 01/14/17 1943 01/14/17 2255 01/14/17 2258 01/15/17 0528 01/16/17 0617 01/18/17 0456  WBC  --  9.3  --  7.6 7.6  --   CREATININE  --  0.58  --  0.53 0.59 0.39*  LATICACIDVEN 2.2*  --  1.3  --   --   --     Estimated Creatinine Clearance: 49.2 mL/min (A) (by C-G formula based on SCr of 0.39 mg/dL (L)).    No Known Allergies  Antimicrobials this admission: Vancomycin 11/4 >> 11/5 Cefepime 11/4 >> Azithromycin 11/6 >>   Dose adjustments this admission: N/A  Microbiology results: 11/4 BCx: no growth D4 11/4 UCx: no growth D4 11/5 MRSA PCR: negative  Procalcitonin: 1.55 >> 2.84   Thank you for allowing pharmacy to be a part of this patient's care.  Lendon Ka, PharmD Pharmacy Resident 01/18/2017 3:43 PM

## 2017-01-18 NOTE — Progress Notes (Signed)
PT Cancellation Note  Patient Details Name: Brooke Trevino MRN: [id number] DOB: [birth year]   Cancelled Treatment:    Reason Eval/Treat Not Completed: Fatigue/lethargy limiting ability to participate; Nursing requested pt's PT eval be held this date secondary to pt fatigue.  Will attempt to see pt at a future date per nursing request.   D. Royetta Asal PT, DPT 01/18/17, 2:23 PM

## 2017-01-18 NOTE — Progress Notes (Addendum)
1800 Today started out well but has deteriorated this afternoon. Early bath and up to the chair went well. Back to bed around 2 pm she became tachypenic. She took her prn sublingual Morphine and slept most of the afternoon. After she awoke she was upset and tearful. Oxygen sats were 83-85%. She became more tearful as CCMD insisted someone check on her constantly because of her oxygen saturations. Husband arrived and has tried to comfort her.1900 oxygen saturations 88-91 % now that she is calm.

## 2017-01-18 NOTE — Progress Notes (Signed)
[place] at [place] NAME: Jatia Musa    MR#:  [id number]  DATE OF BIRTH:  01-28-1936  SUBJECTIVE:  CHIEF COMPLAINT:   Chief Complaint  Patient presents with  . Respiratory Distress   On HFNC 45%. Sitting in a chair  REVIEW OF SYSTEMS:    Review of Systems  Constitutional: Positive for malaise/fatigue. Negative for chills and fever.  HENT: Negative for sore throat.   Eyes: Negative for blurred vision, double vision and pain.  Respiratory: Positive for cough and shortness of breath. Negative for hemoptysis.   Cardiovascular: Negative for chest pain, palpitations, orthopnea and leg swelling.  Gastrointestinal: Negative for abdominal pain, constipation, diarrhea, heartburn, nausea and vomiting.  Genitourinary: Negative for dysuria and hematuria.  Musculoskeletal: Negative for back pain and joint pain.  Skin: Negative for rash.  Neurological: Positive for weakness. Negative for sensory change, speech change, focal weakness and headaches.  Endo/Heme/Allergies: Does not bruise/bleed easily.  Psychiatric/Behavioral: Negative for depression. The patient is not nervous/anxious.     DRUG ALLERGIES:  No Known Allergies  VITALS:  Blood pressure 109/79, pulse 88, temperature 98.5 F (36.9 C), temperature source Oral, resp. rate (!) 33, height 5\' 4"  (1.626 m), weight 55.8 kg (123 lb 0.3 oz), SpO2 (!) 88 %.  PHYSICAL EXAMINATION:   Physical Exam  GENERAL:  81 y.o.-year-old patient  on HFNC EYES: Pupils equal, round, reactive to light and accommodation. No scleral icterus. Extraocular muscles intact.  HEENT: Head atraumatic, normocephalic. Oropharynx and nasopharynx clear.  NECK:  Supple, no jugular venous distention. No thyroid enlargement, no tenderness.  LUNGS: B/L crackles CARDIOVASCULAR: S1, S2 normal. No murmurs, rubs, or gallops.  ABDOMEN: Soft, nontender, nondistended. Bowel sounds present. No organomegaly or mass.  EXTREMITIES:  No cyanosis, clubbing or edema b/l.    NEUROLOGIC: Cranial nerves II through XII are intact. No focal Motor or sensory deficits b/l.   PSYCHIATRIC: The patient is alert and oriented x 3.  SKIN: No obvious rash, lesion, or ulcer.   LABORATORY PANEL:   CBC Recent Labs  Lab 01/16/17 0617  WBC 7.6  HGB 9.2*  HCT 28.1*  PLT 332   ------------------------------------------------------------------------------------------------------------------ Chemistries  Recent Labs  Lab 01/14/17 2255  01/18/17 0456  NA 136   < > 141  K 3.8   < > 3.8  CL 91*   < > 96*  CO2 34*   < > 34*  GLUCOSE 115*   < > 122*  BUN 27*   < > 28*  CREATININE 0.58   < > 0.39*  CALCIUM 7.9*   < > 8.5*  AST 24  --   --   ALT 16  --   --   ALKPHOS 71  --   --   BILITOT 0.8  --   --    < > = values in this interval not displayed.   ------------------------------------------------------------------------------------------------------------------  Cardiac Enzymes Recent Labs  Lab 01/15/17 1040  TROPONINI 0.04*   ------------------------------------------------------------------------------------------------------------------  RADIOLOGY:  Dg Chest Port 1 View  Result Date: 01/18/2017 CLINICAL DATA:  81 year old female admitted with respiratory distress, acute COPD exacerbation. Multiple hypermetabolic lung lesions on July PET-CT. These might have undergone radiation treatment since August, uncertain. EXAM: PORTABLE CHEST 1 VIEW COMPARISON:  01/14/2017 and earlier. FINDINGS: Portable AP supine view at 0550 hours. Large lung volumes. Interval increased opacity at the left lung base is confluent in dense obscuring the left hemidiaphragm. Probable associated small left pleural effusion. Stable patchy  and nodular opacity in the right lung. No pneumothorax. Stable cardiac size and mediastinal contours. IMPRESSION: 1. Dense left lung base collapse or consolidation since 01/14/2017. Probable small left pleural effusion.  2. Otherwise stable hyperinflation, chronic lung disease, and nodular right lung opacity. Electronically Signed   By: Genevie Ann M.D.   On: 01/18/2017 07:38     ASSESSMENT AND PLAN:    * Acute on chronic hypoxic respiratory failure secondary to COPD exacerbation and acute on chronic systolic congestive heart failure On HFNC 50% IV steroids, abx  * Acute on chronic systolic congestive heart failure -Patient has known EF of 20-25% by echo of January 2017 -Continue lisinopril, Coreg IV lasix PRN  * Known multiple lung lesions being treated empirically for presumed non-small cell lung cancer -Patient finished radiation therapy for the right lung at the cancer center  * DVT prophylaxis subcutaneous Lovenox  Poor long term prognosis  CODE STATUS: DNR  DVT Prophylaxis: SCDs  TOTAL TIME TAKING CARE OF THIS PATIENT: 30 minutes.   Hillary Bow R M.D on 01/18/2017 at 1:23 PM  Between 7am to 6pm - Pager - [phone]  After 6pm go to [email or web address] - password [email or web address] [place] [facility]  Office  [phone]  CC: Primary care physician; Sofie Hartigan, MD  Note: This dictation was prepared with Dragon dictation along with smaller phrase technology. Any transcriptional errors that result from this process are unintentional.

## 2017-01-18 NOTE — Progress Notes (Addendum)
Visit Made. Patient seen sitting up in the chair, alert, husband at bedside,. She remains on Hi flo oxygen at 44%, hoping to titrate to 40% and be transferred out of ICU. Patient reports sleeping well. Writer also discussed using the liquid morphine for her increased work of breathing, patient is now much more agreeable and comfortable with using this medication. She did see the effectiveness when she used it yesterday, the 5 mg dose did well. Patient and her husband will need continued education and support regarding using morphine for symptom management. Discussed with staff RN Myra. Patient may require increased oxygen in place at home prior to discharge. Will continue to follow and update hospice team. [name] RN, BSN, [facility] and Palliative care of [place], hospital Liaison [phone] c

## 2017-01-18 NOTE — H&P (Signed)
[facility]  PATIENT NAME: Brooke Trevino    MR#:  [id number]  DATE OF BIRTH:  [birth year]  DATE OF ADMISSION:  01/14/2017  PRIMARY CARE PHYSICIAN: Sofie Hartigan, MD   REQUESTING/REFERRING PHYSICIAN: Joni Fears, MD  CHIEF COMPLAINT:   Chief Complaint  Patient presents with  . Respiratory Distress    HISTORY OF PRESENT ILLNESS:   Resting comfortably on high flow nasal cannula FiO2 at 45% Patient does not seem in any acute respiratory distress Alert and awake Follows commands  REVIEW OF SYSTEMS:  Review of Systems  All other systems reviewed and are negative.    VITAL SIGNS:   Vitals:   01/18/17 0500 01/18/17 0600 01/18/17 0700 01/18/17 0800  BP: 128/69 135/71 (!) 141/77 130/62  Pulse: 82 80 80   Resp: (!) 25 (!) 23 18   Temp:      TempSrc:      SpO2: 93% 91% 93%   Weight:      Height:       Wt Readings from Last 3 Encounters:  01/15/17 123 lb 0.3 oz (55.8 kg)  12/31/16 110 lb 3 oz (50 kg)  11/03/16 116 lb (52.6 kg)    PHYSICAL EXAMINATION:  Physical Exam  Vitals reviewed. Constitutional: She appears well-developed and well-nourished. No distress.  HENT:  Head: Normocephalic and atraumatic.  Mouth/Throat: Oropharynx is clear and moist.  Eyes: Conjunctivae and EOM are normal. Pupils are equal, round, and reactive to light. No scleral icterus.  Neck: Normal range of motion. Neck supple. No tracheal deviation present.  Cardiovascular: Normal rate, regular rhythm and intact distal pulses. Exam reveals no gallop and no friction rub.  No murmur heard. Respiratory: No respiratory distress. She has no wheezes. She has no rales.  GI: Soft. Bowel sounds are normal.  Musculoskeletal: Normal range of motion. She exhibits no edema.  Neurological: She is alert. No cranial nerve deficit.  Skin: Skin is warm and dry. No rash noted. No erythema.  Psychiatric: She has a normal mood and affect. Her behavior is normal. Judgment and thought content normal.     LABORATORY PANEL:   CBC Recent Labs  Lab 01/16/17 0617  WBC 7.6  HGB 9.2*  HCT 28.1*  PLT 332   ------------------------------------------------------------------------------------------------------------------  Chemistries  Recent Labs  Lab 01/14/17 2255  01/18/17 0456  NA 136   < > 141  K 3.8   < > 3.8  CL 91*   < > 96*  CO2 34*   < > 34*  GLUCOSE 115*   < > 122*  BUN 27*   < > 28*  CREATININE 0.58   < > 0.39*  CALCIUM 7.9*   < > 8.5*  AST 24  --   --   ALT 16  --   --   ALKPHOS 71  --   --   BILITOT 0.8  --   --    < > = values in this interval not displayed.   IMPRESSION AND PLAN:   81 yo white female with acute COPD exacerbation    Acute on chronic respiratory failure with hypoxia (HCC) -continue BiPAP, admit to stepdown, IV Solu-Medrol, antibiotics Active Problems:   COPD exacerbation ([place]) -continue home meds, other treatment as above   Essential hypertension, benign -continue home medications   Chronic systolic CHF (congestive heart failure) (HCC) -  1.  wean BiPAP as tolerated 2.  Oxygen as needed 3.  Steroids and bronchodilator therapy as prescribed 4.  Continue IV antibiotics Pro  calcitonin level is elevated  CODE STATUS: DNR   Corrin Parker, M.D.  [facility]  Medical Director [place] Director [facility] Cardio-Pulmonary Department

## 2017-01-19 DIAGNOSIS — I5022 Chronic systolic (congestive) heart failure: Secondary | ICD-10-CM

## 2017-01-19 LAB — CULTURE, BLOOD (ROUTINE X 2)
CULTURE: NO GROWTH
Culture: NO GROWTH
Special Requests: ADEQUATE
Special Requests: ADEQUATE

## 2017-01-19 LAB — CBC
HEMATOCRIT: 28.9 % — AB (ref 35.0–47.0)
Hemoglobin: 9.2 g/dL — ABNORMAL LOW (ref 12.0–16.0)
MCH: 29.3 pg (ref 26.0–34.0)
MCHC: 31.8 g/dL — AB (ref 32.0–36.0)
MCV: 91.9 fL (ref 80.0–100.0)
Platelets: 408 10*3/uL (ref 150–440)
RBC: 3.15 MIL/uL — ABNORMAL LOW (ref 3.80–5.20)
RDW: 15.5 % — AB (ref 11.5–14.5)
WBC: 7.5 10*3/uL (ref 3.6–11.0)

## 2017-01-19 MED ORDER — FUROSEMIDE 10 MG/ML IJ SOLN
40.0000 mg | Freq: Once | INTRAMUSCULAR | Status: AC
  Administered 2017-01-19: 40 mg via INTRAVENOUS
  Filled 2017-01-19: qty 4

## 2017-01-19 MED ORDER — AZITHROMYCIN 500 MG PO TABS
500.0000 mg | ORAL_TABLET | Freq: Every day | ORAL | Status: AC
  Administered 2017-01-19 – 2017-01-21 (×3): 500 mg via ORAL
  Filled 2017-01-19 (×3): qty 1

## 2017-01-19 MED ORDER — KETOROLAC TROMETHAMINE 15 MG/ML IJ SOLN
15.0000 mg | Freq: Four times a day (QID) | INTRAMUSCULAR | Status: DC | PRN
  Administered 2017-01-21: 14:00:00 15 mg via INTRAVENOUS
  Filled 2017-01-19 (×2): qty 1

## 2017-01-19 MED ORDER — HYDROCORTISONE ACE-PRAMOXINE 1-1 % RE FOAM
1.0000 | Freq: Three times a day (TID) | RECTAL | Status: DC | PRN
  Administered 2017-01-19 – 2017-01-21 (×2): 1 via RECTAL
  Filled 2017-01-19: qty 10

## 2017-01-19 MED ORDER — ALBUTEROL SULFATE (2.5 MG/3ML) 0.083% IN NEBU: 2.5000 mg | INHALATION_SOLUTION | RESPIRATORY_TRACT | Status: DC | PRN

## 2017-01-19 MED ORDER — HYDROCORTISONE ACE-PRAMOXINE 1-1 % RE FOAM
1.0000 | Freq: Two times a day (BID) | RECTAL | Status: DC | PRN
  Administered 2017-01-19: 1 via RECTAL
  Filled 2017-01-19: qty 10

## 2017-01-19 NOTE — Progress Notes (Signed)
Pharmacy Antibiotic Note  Brooke Trevino is a 81 y.o. female admitted on 01/14/2017 with pneumonia and acute respiratory failure.  Pharmacy has been consulted for cefepime dosing. Patient was also started on azithromycin for atypical coverage.   Plan: Continue Cefepime 2g IV Q24hr for a total of 8 days.  Continue Azithromycin 500mg  Q24H x 5 days.  Pharmacy will continue to follow and adjust as needed.   MRSA PCR is negative; vancomycin discontinued during AM rounds.  Height: 5\' 4"  (162.6 cm) Weight: 121 lb 14.6 oz (55.3 kg) IBW/kg (Calculated) : 54.7  Temp (24hrs), Avg:98.1 F (36.7 C), Min:97.7 F (36.5 C), Max:98.4 F (36.9 C)  Recent Labs  Lab 01/14/17 1943 01/14/17 2255 01/14/17 2258 01/15/17 0528 01/16/17 0617 01/18/17 0456 01/19/17 0329  WBC  --  9.3  --  7.6 7.6  --  7.5  CREATININE  --  0.58  --  0.53 0.59 0.39*  --   LATICACIDVEN 2.2*  --  1.3  --   --   --   --     Estimated Creatinine Clearance: 49.2 mL/min (A) (by C-G formula based on SCr of 0.39 mg/dL (L)).    No Known Allergies  Antimicrobials this admission: Vancomycin 11/4 >> 11/5 Cefepime 11/4 >> Azithromycin 11/6 >>   Dose adjustments this admission: N/A  Microbiology results: 11/4 BCx: NG 11/4 UCx: NG 11/5 MRSA PCR: negative  Procalcitonin: 1.55 >> 2.84   Thank you for allowing pharmacy to be a part of this patient's care.  Lendon Ka, PharmD Pharmacy Resident 01/19/2017 1:58 PM

## 2017-01-19 NOTE — Progress Notes (Signed)
Advance care planning  Met with patient and granddaughter at bedside and also discussed with patient's husband on the phone. Discussed regarding patient's acute on chronic respiratory failure, COPD, deteriorating respiratory status over the past few months.  Patient feels guilty that she has done this to herself with her prolonged smoking.  Gets tearful at times.  She showed me her DNR band and tells me that this decision was made by her and her husband after a long period of thinking through it.  She understands her long-term poor prognosis tells me she has hospice services at home.  Her goal is to eventually go home after she gets better.  She tells me she has no illusion that she will live forever.  Her granddaughter asked me if she can go home while on high flow nasal cannula.  We discussed regarding amount of oxygen limitations at home.  Hospice can provide up to 15 L.  Also advised patient that we have options of keeping her home with hospice and not checking oxygen saturations.  In managing symptoms with pain and anxiety medications.  Patient is not ready for this and wants to reconsider in the next day or 2. Patient wants to talk to rest of her family prior to making any decisions at this point and also wants to consider if she wants to come back to the hospital or not after discharge.  Time spent 20 minutes.

## 2017-01-19 NOTE — Progress Notes (Signed)
Visit made. Patient seen lying in bed, husband at bedside. She appeared anxious and some what irritable with increased work of breathing.  Discussed getting a dose of liquid morphine, she was agreeable. Staff RN Brittney made aware and will give the 5 mg dose as ordered. She remains on Hi flo oxygen at 35% and IV antibiotics. No discharge date at this time. Will continue to follow and update hospice team.  [name] RN, BSN, [facility], hospital Liaison [phone] c

## 2017-01-19 NOTE — Progress Notes (Signed)
[facility] [place] NAME: Brooke Trevino    MR#:  [id number]  DATE OF BIRTH:  Aug 25, 1935  SUBJECTIVE:  CHIEF COMPLAINT:   Chief Complaint  Patient presents with  . Respiratory Distress   On HFNC . Had acute SOB earlier today. Now some improvement. Grand daughter at bedside.  REVIEW OF SYSTEMS:    Review of Systems  Constitutional: Positive for malaise/fatigue. Negative for chills and fever.  HENT: Negative for sore throat.   Eyes: Negative for blurred vision, double vision and pain.  Respiratory: Positive for cough and shortness of breath. Negative for hemoptysis.   Cardiovascular: Negative for chest pain, palpitations, orthopnea and leg swelling.  Gastrointestinal: Negative for abdominal pain, constipation, diarrhea, heartburn, nausea and vomiting.  Genitourinary: Negative for dysuria and hematuria.  Musculoskeletal: Negative for back pain and joint pain.  Skin: Negative for rash.  Neurological: Positive for weakness. Negative for sensory change, speech change, focal weakness and headaches.  Endo/Heme/Allergies: Does not bruise/bleed easily.  Psychiatric/Behavioral: Negative for depression. The patient is not nervous/anxious.     DRUG ALLERGIES:  No Known Allergies  VITALS:  Blood pressure 111/72, pulse 97, temperature 98 F (36.7 C), temperature source Oral, resp. rate (!) 28, height 5\' 4"  (1.626 m), weight 55.3 kg (121 lb 14.6 oz), SpO2 91 %.  PHYSICAL EXAMINATION:   Physical Exam  GENERAL:  81 y.o.-year-old patient  on HFNC EYES: Pupils equal, round, reactive to light and accommodation. No scleral icterus. Extraocular muscles intact.  HEENT: Head atraumatic, normocephalic. Oropharynx and nasopharynx clear.  NECK:  Supple, no jugular venous distention. No thyroid enlargement, no tenderness.  LUNGS: B/L crackles CARDIOVASCULAR: S1, S2 normal. No murmurs, rubs, or gallops.  ABDOMEN: Soft, nontender, nondistended. Bowel  sounds present. No organomegaly or mass.  EXTREMITIES: No cyanosis, clubbing or edema b/l.    NEUROLOGIC: Cranial nerves II through XII are intact. No focal Motor or sensory deficits b/l.   PSYCHIATRIC: The patient is alert and oriented x 3.  SKIN: No obvious rash, lesion, or ulcer.   LABORATORY PANEL:   CBC Recent Labs  Lab 01/19/17 0329  WBC 7.5  HGB 9.2*  HCT 28.9*  PLT 408   ------------------------------------------------------------------------------------------------------------------ Chemistries  Recent Labs  Lab 01/14/17 2255  01/18/17 0456  NA 136   < > 141  K 3.8   < > 3.8  CL 91*   < > 96*  CO2 34*   < > 34*  GLUCOSE 115*   < > 122*  BUN 27*   < > 28*  CREATININE 0.58   < > 0.39*  CALCIUM 7.9*   < > 8.5*  AST 24  --   --   ALT 16  --   --   ALKPHOS 71  --   --   BILITOT 0.8  --   --    < > = values in this interval not displayed.   ------------------------------------------------------------------------------------------------------------------  Cardiac Enzymes Recent Labs  Lab 01/15/17 1040  TROPONINI 0.04*   ------------------------------------------------------------------------------------------------------------------  RADIOLOGY:  Dg Chest Port 1 View  Result Date: 01/18/2017 CLINICAL DATA:  81 year old female admitted with respiratory distress, acute COPD exacerbation. Multiple hypermetabolic lung lesions on July PET-CT. These might have undergone radiation treatment since August, uncertain. EXAM: PORTABLE CHEST 1 VIEW COMPARISON:  01/14/2017 and earlier. FINDINGS: Portable AP supine view at 0550 hours. Large lung volumes. Interval increased opacity at the left lung base is confluent in dense obscuring the left hemidiaphragm. Probable  associated small left pleural effusion. Stable patchy and nodular opacity in the right lung. No pneumothorax. Stable cardiac size and mediastinal contours. IMPRESSION: 1. Dense left lung base collapse or consolidation  since 01/14/2017. Probable small left pleural effusion. 2. Otherwise stable hyperinflation, chronic lung disease, and nodular right lung opacity. Electronically Signed   By: Genevie Ann M.D.   On: 01/18/2017 07:38   ASSESSMENT AND PLAN:    * Acute on chronic hypoxic respiratory failure secondary to COPD exacerbation and acute on chronic systolic congestive heart failure On HFNC steroids, abx Still acutely ill  * Acute on chronic systolic congestive heart failure -Patient has known EF of 20-25% by echo of January 2017 -Continue lisinopril, Coreg IV lasix PRN.  * Known multiple lung lesions being treated empirically for presumed non-small cell lung cancer. -Patient finished radiation therapy for the right lung at the cancer center  * DVT prophylaxis subcutaneous Lovenox  Poor long term prognosis  CODE STATUS: DNR  DVT Prophylaxis: SCDs  TOTAL TIME TAKING CARE OF THIS PATIENT: 30 minutes.   Hillary Bow R M.D on 01/19/2017 at 11:13 AM  Between 7am to 6pm - Pager - [phone]  After 6pm go to [email or web address] - password [email or web address] [facility]  Office  [phone]  CC: Primary care physician; Sofie Hartigan, MD  Note: This dictation was prepared with Dragon dictation along with smaller phrase technology. Any transcriptional errors that result from this process are unintentional.

## 2017-01-19 NOTE — Plan of Care (Signed)
Patient slept well this shift.  PRN morphine given for SOB and anxiety.  No acute distress noted.

## 2017-01-19 NOTE — H&P (Signed)
[facility]  PATIENT NAME: Brooke Trevino    MR#:  [id number]  DATE OF BIRTH:  09-08-1935  DATE OF ADMISSION:  01/14/2017  PRIMARY CARE PHYSICIAN: Sofie Hartigan, MD   REQUESTING/REFERRING PHYSICIAN: Joni Fears, MD  CHIEF COMPLAINT:   Chief Complaint  Patient presents with  . Respiratory Distress    HISTORY OF PRESENT ILLNESS:   Resting comfortably on high flow nasal cannula FiO2 at 45% Patient does not seem in any acute respiratory distress Alert and awake Follows commands  REVIEW OF SYSTEMS:  Review of Systems  All other systems reviewed and are negative.    VITAL SIGNS:   Vitals:   01/19/17 0700 01/19/17 0800 01/19/17 1000 01/19/17 1100  BP: (!) 116/99 104/69 116/74 111/72  Pulse: 86 82 95 97  Resp: (!) 23 18 (!) 25 (!) 28  Temp:  98 F (36.7 C)    TempSrc:  Oral    SpO2: 91% 92% 90% 91%  Weight:      Height:       Wt Readings from Last 3 Encounters:  01/19/17 121 lb 14.6 oz (55.3 kg)  12/31/16 110 lb 3 oz (50 kg)  11/03/16 116 lb (52.6 kg)    PHYSICAL EXAMINATION:  Physical Exam  Vitals reviewed. Constitutional: She is oriented to person, place, and time. No distress.  Cachectic, frail  HENT:  Mouth/Throat: Oropharynx is clear and moist.  Eyes: No scleral icterus.  Neck: Normal range of motion. Neck supple. No tracheal deviation present.  Cardiovascular: Normal rate, regular rhythm and intact distal pulses. Exam reveals no gallop and no friction rub.  No murmur heard. Respiratory: Effort normal. No respiratory distress. She has no wheezes. She has rales.  GI: Soft.  Musculoskeletal: Normal range of motion. She exhibits edema.  Neurological: She is alert and oriented to person, place, and time. No cranial nerve deficit.  Skin: Skin is warm and dry. No rash noted. No erythema.  Psychiatric: She has a normal mood and affect. Her behavior is normal. Judgment and thought content normal.    LABORATORY PANEL:   CBC Recent Labs  Lab  01/19/17 0329  WBC 7.5  HGB 9.2*  HCT 28.9*  PLT 408   ------------------------------------------------------------------------------------------------------------------  Chemistries  Recent Labs  Lab 01/14/17 2255  01/18/17 0456  NA 136   < > 141  K 3.8   < > 3.8  CL 91*   < > 96*  CO2 34*   < > 34*  GLUCOSE 115*   < > 122*  BUN 27*   < > 28*  CREATININE 0.58   < > 0.39*  CALCIUM 7.9*   < > 8.5*  AST 24  --   --   ALT 16  --   --   ALKPHOS 71  --   --   BILITOT 0.8  --   --    < > = values in this interval not displayed.   IMPRESSION AND PLAN:   81 yo white female with acute COPD exacerbation    Acute on chronic respiratory failure with hypoxia (HCC) -continue BiPAP, admit to stepdown, IV Solu-Medrol, antibiotics Active Problems:   COPD exacerbation ([place]) -continue home meds, other treatment as above   Essential hypertension, benign -continue home medications   Chronic systolic CHF (congestive heart failure) ([place]) -  1.  wean high flow [place] as tolerated 2.  Oxygen as needed 3.  Steroids and bronchodilator therapy as prescribed 4.  Continue IV antibiotics Pro calcitonin level is  elevated  CODE STATUS: DNR/DNI   Corrin Parker, M.D.  [facility]  Medical Director [place] Director [place] Department

## 2017-01-19 NOTE — Progress Notes (Signed)
Patient rested well this shift. PRN morphine given x 1 for shortness of breath.  Remains on HFNC at 40%.  Patient refused repositioning by staff. Able to reposition on her own.  Makes needs known  All meds given and tolerated.  No acute distress noted.  BM x 1. Moderate amount.  Will continue to monitor.

## 2017-01-19 NOTE — Progress Notes (Signed)
PT Cancellation Note  Patient Details Name: Brooke Trevino MRN: [id number] DOB: May 08, 1935   Cancelled Treatment:    Reason Eval/Treat Not Completed: Patient declined, no reason specified; Pt declined to participate with PT services this date secondary to fatigue but did report that would try to work with PT services at a future date.   Linus Salmons PT, DPT 01/19/17, 12:13 PM

## 2017-01-20 MED ORDER — FUROSEMIDE 20 MG PO TABS
20.0000 mg | ORAL_TABLET | Freq: Every day | ORAL | Status: DC
  Administered 2017-01-20 – 2017-01-21 (×2): 20 mg via ORAL
  Filled 2017-01-20 (×2): qty 1

## 2017-01-20 MED ORDER — LISINOPRIL 5 MG PO TABS
2.5000 mg | ORAL_TABLET | Freq: Every day | ORAL | Status: DC
  Administered 2017-01-20: 2.5 mg via ORAL
  Filled 2017-01-20: qty 1

## 2017-01-20 NOTE — Plan of Care (Signed)
  Education: Knowledge of General Education information will improve 01/20/2017 0425 - Progressing by Doristine Devoid, RN 01/20/2017 0303 - Progressing by Doristine Devoid, RN   Health Behavior/Discharge Planning: Ability to manage health-related needs will improve 01/20/2017 0425 - Progressing by Doristine Devoid, RN 01/20/2017 0303 - Progressing by Doristine Devoid, RN   Clinical Measurements: Ability to maintain clinical measurements within normal limits will improve 01/20/2017 0425 - Progressing by Doristine Devoid, RN 01/20/2017 0303 - Progressing by Doristine Devoid, RN   Clinical Measurements: Cardiovascular complication will be avoided 01/20/2017 0425 - Progressing by Doristine Devoid, RN 01/20/2017 0303 - Progressing by Doristine Devoid, RN   Activity: Risk for activity intolerance will decrease 01/20/2017 0425 - Not Progressing by Doristine Devoid, RN 01/20/2017 0303 - Progressing by Doristine Devoid, RN   Clinical Measurements: Respiratory complications will improve 01/20/2017 0425 - Not Progressing by Doristine Devoid, RN 01/20/2017 0303 - Not Progressing by Doristine Devoid, RN

## 2017-01-20 NOTE — Evaluation (Signed)
Physical Therapy Evaluation Patient Details Name: Brooke Trevino MRN: [id number] DOB: 1935-07-25 Today's Date: 01/20/2017   History of Present Illness  Pt is a 81 y.o. female presenting to hospital with increasing SOB and placed on BiPAP initially (now on HFNC).  Pt admitted with acute on chronic hypoxic respiratory failure secondary to COPD exacerbation and L [place] PNA.  PMH includes COPD, htn, AAA, CHF, multiple lung lesions (presumed non-small cell lung CA).  Clinical Impression  Prior to hospital admission, pt was most recently w/c level with functional mobility d/t SOB (pt reports her husband or granddaughter are always with her when she gets up to move).  Pt lives with her husband in 1 level home with level entry.  Currently pt is SBA with bed mobility and CGA standing with single UE support.  Pt's O2 sats initially 90% on HFNC resting in bed, 89-90% sitting edge of bed, 84% sitting edge of bed after standing (pt declining to lay down and able to increase O2 sats back up to 90% with pursed lip breathing), and then O2 decreased again to 84% laying down in bed end of session (O2 increased back to 90% on HFNC after a few minutes rest, vc's for pursed lip breathing, and repositioning in bed).  Nursing notified of above vitals.  Initially pt appearing very anxious during session but with activity and reassurance decreased anxiety noted and pt appearing motivated to participate.  Pt would benefit from skilled PT to address noted impairments and functional limitations (see below for any additional details) during hospital stay.  Currently pt is mostly limited d/t pulmonary status.  Upon hospital discharge, recommend pt discharge to home with assist of family (pt also receiving hospice in the home).    Follow Up Recommendations No PT follow up;Other (comment);Supervision/Assistance - 24 hour(Would benefit from pulmonary rehab.)    Equipment Recommendations  Other (comment)(pt has manual and power w/c  at home already)    Recommendations for Other Services       Precautions / Restrictions Precautions Precautions: Fall Restrictions Weight Bearing Restrictions: No      Mobility  Bed Mobility Overal bed mobility: Needs Assistance Bed Mobility: Supine to Sit;Sit to Supine     Supine to sit: Supervision;HOB elevated Sit to supine: Supervision;HOB elevated   General bed mobility comments: mild increased effort to perform; mild to moderate SOB with this activity; 1 assist to boost up in bed end of session (assist required d/t pt's SOB)  Transfers Overall transfer level: Needs assistance Equipment used: None;1 person hand held assist Transfers: Sit to/from Stand Sit to Stand: Min guard         General transfer comment: good stand; CGA for safety; stood for approximately 30 seconds before pt requesting to sit d/t SOB  Ambulation/Gait             General Gait Details: Deferred d/t O2 desaturation and SOB with standing  Stairs            Wheelchair Mobility    Modified Rankin (Stroke Patients Only)       Balance Overall balance assessment: Needs assistance Sitting-balance support: No upper extremity supported;Feet supported Sitting balance-Leahy Scale: Good Sitting balance - Comments: sitting reaching within BOS   Standing balance support: Single extremity supported Standing balance-Leahy Scale: Fair Standing balance comment: static standing steady                             Pertinent  Vitals/Pain Pain Assessment: 0-10 Pain Score: 3  Pain Location: hurts on her "bottom" Pain Descriptors / Indicators: Sore Pain Intervention(s): Limited activity within patient's tolerance;Monitored during session;Repositioned  HR WFL during session.    Home Living Family/patient expects to be discharged to:: Private residence Living Arrangements: Spouse/significant other Available Help at Discharge: Family Type of Home: House Home Access: Level entry      [place]: [place]: Eclect[place] - power;Walker - 4 wheels;Shower seat - built in;Shower seat      Prior Function Level of Independence: Needs assistance   Gait / Transfers Assistance Needed: Has been using w/c most recently for mobility d/t difficulty breathing (otherwise walks short distances in home holding onto furniture; limited by cardiopulmonary status).  Denies any falls in past 6 months.  Reports her husband or granddaughter is with her whenever she gets up to move and w/c is always next to her.     Comments: Pt has almost 24/7 assist from her husband and pt's granddaughter comes 5x/week to assist pt as well.  Uses chronic 4 L home O2.     Hand Dominance        Extremity/Trunk Assessment   Upper Extremity Assessment Upper Extremity Assessment: Generalized weakness    Lower Extremity Assessment Lower Extremity Assessment: Generalized weakness    Cervical / Trunk Assessment Cervical / Trunk Assessment: Normal  Communication   Communication: No difficulties  Cognition Arousal/Alertness: Awake/alert Behavior During Therapy: Anxious Overall Cognitive Status: Within Functional Limits for tasks assessed                                        General Comments General comments (skin integrity, edema, etc.): Pt resting in bed on HFNC upon PT entering room.  Nursing cleared pt for participation in physical therapy.  Pt agreeable to PT session.    Exercises  Pt sat on edge of bed with focus on positioning, sitting activity tolerance, and pursed lip breathing activities.   Assessment/Plan    PT Assessment Patient needs continued PT services  PT Problem List Decreased strength;Decreased activity tolerance;Decreased balance;Decreased mobility;Cardiopulmonary status limiting activity       PT Treatment Interventions DME instruction;Gait training;Functional mobility training;Therapeutic activities;Therapeutic  exercise;Balance training;Patient/family education    PT Goals (Current goals can be found in the Care Plan section)  Acute Rehab PT Goals Patient Stated Goal: to go home PT Goal Formulation: With patient Time For Goal Achievement: 02/03/17 Potential to Achieve Goals: Good    Frequency Min 2X/week   Barriers to discharge        Co-evaluation               AM-PAC PT "6 Clicks" Daily Activity  Outcome Measure Difficulty turning over in bed (including adjusting bedclothes, sheets and blankets)?: A Little Difficulty moving from lying on back to sitting on the side of the bed? : A Little Difficulty sitting down on and standing up from a chair with arms (e.g., wheelchair, bedside commode, etc,.)?: Unable Help needed moving to and from a bed to chair (including a wheelchair)?: A Little Help needed walking in hospital room?: A Lot Help needed climbing 3-5 steps with a railing? : A Lot 6 Click Score: 14    End of Session Equipment Utilized During Treatment: Gait belt;Oxygen(40 L/min via HFNC) Activity Tolerance: Other (comment)(Limited d/t SOB and O2 desaturation with activity) Patient left: in  bed;with bed alarm set;with call bell/phone within reach Nurse Communication: Mobility status;Precautions;Other (comment)(Pt's O2 sats) PT Visit Diagnosis: Other abnormalities of gait and mobility (R26.89);Muscle weakness (generalized) (M62.81)    Time: 1610-9604 PT Time Calculation (min) (ACUTE ONLY): 33 min   Charges:   PT Evaluation $PT Eval Low Complexity: 1 Low PT Treatments $Therapeutic Activity: 8-22 mins   PT G Codes:   PT G-Codes **NOT FOR INPATIENT CLASS** Functional Assessment Tool Used: AM-PAC 6 Clicks Basic Mobility Functional Limitation: Mobility: Walking and moving around Mobility: Walking and Moving Around Current Status (V4098): At least 40 percent but less than 60 percent impaired, limited or restricted Mobility: Walking and Moving Around Goal Status [phone]): At  least 20 percent but less than 40 percent impaired, limited or restricted    Leitha Bleak, PT 01/20/17, 3:36 PM [phone]

## 2017-01-20 NOTE — Progress Notes (Signed)
   CHIEF COMPLAINT:   Chief Complaint  Patient presents with  . Respiratory Distress    Subjective  Follow up resp failure On high flow [place] Alert and awake Ok to transfer to floor     Objective   Examination:  General exam: Appears calm and comfortable  Respiratory system: Clear to auscultation. Respiratory effort normal. +crackles HEENT: [place]/AT, PERRLA, no thrush, no stridor. Cardiovascular system: S1 & S2 heard, RRR. No JVD, murmurs, rubs, gallops or clicks. No pedal edema. Gastrointestinal system: Abdomen is nondistended, soft and nontender. No organomegaly or masses felt. Normal bowel sounds heard. Central nervous system: Alert and oriented. No focal neurological deficits. Extremities: Symmetric 5 x 5 power. Skin: No rashes, lesions or ulcers Psychiatry: Judgement and insight appear normal. Mood & affect appropriate.   VITALS:  height is 5\' 4"  (1.626 m) and weight is 121 lb 14.6 oz (55.3 kg). Her oral temperature is 98.6 F (37 C). Her blood pressure is 117/75 and her pulse is 81. Her respiration is 20 and oxygen saturation is 87 (abnormal)%.   I personally reviewed Labs under Results section.      Assessment/Plan:  81 yo white female with acute COPD exacerbation    Acute on chronic respiratory failure with hypoxia (HCC) -off BiPAP, admit to stepdown, IV Solu-Medrol, antibiotics Active Problems:   COPD exacerbation ([place]) -continue home meds, other treatment as above   Essential hypertension, benign -continue home medications   Chronic systolic CHF (congestive heart failure) (HCC) -  1.  wean high flow [place] as tolerated fio2 at 40% 2.  Oxygen as needed 3.  Steroids and bronchodilator therapy as prescribed 4.  Continue IV antibiotics Pro calcitonin level is elevated  CODE STATUS: DNR/DNI   Corrin Parker, M.D.  [facility]  Medical Director [place] Director [facility] Cardio-Pulmonary Department      Disposition Plan: recommend hospice

## 2017-01-20 NOTE — Progress Notes (Signed)
Patient given a total of 10 mg morphine. 5 mg was initially given for c/o SOB. Per NP, 5 mg given for pain in rectum.  10 mg resulted in positive affect.  Patient rested and slept this shift.  Remains on HFNC at 40%.  Will continue to monitor.

## 2017-01-20 NOTE — Progress Notes (Signed)
Brooke Trevino transferred to room 117 at 10:50 am. Patient remains on high flow nasal cannula at 40%.. VSS. Foley in place. Urine output adequate. Patient belongings and medications sent with patient. Report given to Legrand Como, RN

## 2017-01-20 NOTE — Progress Notes (Signed)
Patient ID: Brooke Trevino, female   DOB: September 26, 1935, 81 y.o.   MRN: [id number]  [facility] PROGRESS NOTE  Brooke Trevino WUJ:[id number] DOB: [birth year] DOA: 01/14/2017 PCP: Sofie Hartigan, MD  HPI/Subjective: Patient has on and off episodes of breathing difficulties.  She thinks anxiety may play a little bit of a role.  Today breathing a little bit better than yesterday.  Objective: Vitals:   01/20/17 1123 01/20/17 1336  BP:    Pulse:    Resp:    Temp:    SpO2: 96% 90%    Intake/Output Summary (Last 24 hours) at 01/20/2017 1353 Last data filed at 01/20/2017 1100 Gross per 24 hour  Intake 527 ml  Output 1860 ml  Net -1333 ml   Filed Weights   01/14/17 1937 01/15/17 0145 01/19/17 0500  Weight: 49.9 kg (110 lb) 55.8 kg (123 lb 0.3 oz) 55.3 kg (121 lb 14.6 oz)    ROS: Review of Systems  Constitutional: Negative for chills and fever.  Eyes: Negative for blurred vision.  Respiratory: Positive for cough and shortness of breath.   Cardiovascular: Negative for chest pain.  Gastrointestinal: Negative for abdominal pain, constipation, diarrhea, nausea and vomiting.  Genitourinary: Negative for dysuria.  Musculoskeletal: Negative for joint pain.  Neurological: Negative for dizziness and headaches.   Exam: Physical Exam  Constitutional: She is oriented to person, place, and time.  HENT:  Nose: No mucosal edema.  Mouth/Throat: No oropharyngeal exudate or posterior oropharyngeal edema.  Eyes: Conjunctivae, EOM and lids are normal. Pupils are equal, round, and reactive to light.  Neck: No JVD present. Carotid bruit is not present. No edema present. No thyroid mass and no thyromegaly present.  Cardiovascular: S1 normal and S2 normal. Exam reveals no gallop.  No murmur heard. Pulses:      Dorsalis pedis pulses are 2+ on the right side, and 2+ on the left side.  Respiratory: No respiratory distress. She has decreased breath sounds in the right middle field, the  right lower field, the left middle field and the left lower field. She has no wheezes. She has rhonchi in the right lower field and the left lower field. She has no rales.  GI: Soft. Bowel sounds are normal. There is no tenderness.  Musculoskeletal:       Right ankle: She exhibits no swelling.       Left ankle: She exhibits no swelling.  Lymphadenopathy:    She has no cervical adenopathy.  Neurological: She is alert and oriented to person, place, and time. No cranial nerve deficit.  Skin: Skin is warm. No rash noted. Nails show no clubbing.  Psychiatric: She has a normal mood and affect.      Data Reviewed: Basic Metabolic Panel: Recent Labs  Lab 01/14/17 2255 01/15/17 0528 01/16/17 0617 01/18/17 0456  NA 136 135 138 141  K 3.8 4.1 4.0 3.8  CL 91* 94* 95* 96*  CO2 34* 32 36* 34*  GLUCOSE 115* 133* 118* 122*  BUN 27* 24* 26* 28*  CREATININE 0.58 0.53 0.59 0.39*  CALCIUM 7.9* 8.1* 8.7* 8.5*   Liver Function Tests: Recent Labs  Lab 01/14/17 2255  AST 24  ALT 16  ALKPHOS 71  BILITOT 0.8  PROT 5.9*  ALBUMIN 2.3*   Recent Labs  Lab 01/14/17 2255  LIPASE 21   CBC: Recent Labs  Lab 01/14/17 2255 01/15/17 0528 01/16/17 0617 01/19/17 0329  WBC 9.3 7.6 7.6 7.5  NEUTROABS 8.3*  --   --   --  HGB 9.8* 9.2* 9.2* 9.2*  HCT 30.5* 28.3* 28.1* 28.9*  MCV 93.8 92.8 92.0 91.9  PLT 290 288 332 408   Cardiac Enzymes: Recent Labs  Lab 01/14/17 2255 01/15/17 0528 01/15/17 1040  TROPONINI 0.04* 0.03* 0.04*   BNP (last 3 results) Recent Labs    12/19/16 1356  BNP 930.0*    CBG: Recent Labs  Lab 01/15/17 0152  GLUCAP 90    Recent Results (from the past 240 hour(s))  Urine culture     Status: None   Collection Time: 01/14/17 10:45 PM  Result Value Ref Range Status   Specimen Description URINE, RANDOM  Final   Special Requests NONE  Final   Culture   Final    NO GROWTH Performed at [facility], [place] [address], [place], [place] [address]     Report Status 01/16/2017 FINAL  Final  CULTURE, BLOOD (ROUTINE X 2) w Reflex to ID Panel     Status: None   Collection Time: 01/14/17 10:57 PM  Result Value Ref Range Status   Specimen Description BLOOD LEFT ANTECUBITAL  Final   Special Requests   Final    BOTTLES DRAWN AEROBIC AND ANAEROBIC Blood Culture adequate volume   Culture NO GROWTH 5 DAYS  Final   Report Status 01/19/2017 FINAL  Final  CULTURE, BLOOD (ROUTINE X 2) w Reflex to ID Panel     Status: None   Collection Time: 01/14/17 11:13 PM  Result Value Ref Range Status   Specimen Description BLOOD RIGHT ANTECUBITAL  Final   Special Requests   Final    BOTTLES DRAWN AEROBIC AND ANAEROBIC Blood Culture adequate volume   Culture NO GROWTH 5 DAYS  Final   Report Status 01/19/2017 FINAL  Final  MRSA PCR Screening     Status: None   Collection Time: 01/15/17  2:11 AM  Result Value Ref Range Status   MRSA by PCR NEGATIVE NEGATIVE Final    Comment:        The GeneXpert MRSA Assay (FDA approved for NASAL specimens only), is one component of a comprehensive MRSA colonization surveillance program. It is not intended to diagnose MRSA infection nor to guide or monitor treatment for MRSA infections.       Scheduled Meds: . aspirin  81 mg Oral QHS  . azithromycin  500 mg Oral Daily  . budesonide (PULMICORT) nebulizer solution  0.5 mg Nebulization BID  . chlorhexidine  15 mL Mouth Rinse BID  . citalopram  10 mg Oral Daily  . enoxaparin (LOVENOX) injection  40 mg Subcutaneous Q24H  . feeding supplement (ENSURE ENLIVE)  237 mL Oral TID BM  . ipratropium-albuterol  3 mL Nebulization TID  . mouth rinse  15 mL Mouth Rinse q12n4p  . mometasone-formoterol  2 puff Inhalation BID  . multivitamin with minerals  1 tablet Oral Daily  . pravastatin  20 mg Oral QHS  . predniSONE  20 mg Oral Q breakfast  . temazepam  30 mg Oral QHS   Continuous Infusions: . ceFEPime (MAXIPIME) IV Stopped (01/19/17 1824)    Assessment/Plan:  1. Acute  on chronic hypoxic respiratory failure secondary to COPD exacerbation and pneumonia left lower lobe.  Patient still on high flow nasal cannula 45% oxygen.  Patient is tapered down to oral steroids.  Patient on IV Maxipime, Zithromax.  Pro-calcitonin on 11/5 was elevated at 2.84.  Chest x-ray shows a dense consolidation left lower lobe. 2. Chronic systolic congestive heart failure.  Restart Lasix.  Beta-blocker on hold with wheezing.  Restart low-dose lisinopril. 3. Known multiple lung lesions treated empirically for presumed non-small cell lung cancer 4. Hyperlipidemia unspecified on pravastatin 5. Depression on Celexa 6. Weakness.  I advised the patient that she must work with physical therapy  Code Status:     Code Status Orders  (From admission, onward)        Start     Ordered   01/15/17 0212  Do not attempt resuscitation (DNR)  Continuous    Question Answer Comment  In the event of cardiac or respiratory ARREST Do not call a "code blue"   In the event of cardiac or respiratory ARREST Do not perform Intubation, CPR, defibrillation or ACLS   In the event of cardiac or respiratory ARREST Use medication by any route, position, wound care, and other measures to relive pain and suffering. May use oxygen, suction and manual treatment of airway obstruction as needed for comfort.      01/15/17 1191    Code Status History    Date Active Date Inactive Code Status Order ID Comments User Context   12/25/2016 11:24 12/31/2016 15:12 DNR [id number]  Wilhelmina Mcardle, MD Inpatient   12/19/2016 17:51 12/25/2016 11:24 Full Code [id number]  Fritzi Mandes, MD Inpatient   08/09/2015 16:45 08/13/2015 17:49 Full Code [id number]  Bettey Costa, MD ED    Advance Directive Documentation     Most Recent Value  Type of Advance Directive  Out of facility DNR (pink MOST or yellow form)  Pre-existing out of facility DNR order (yellow form or pink MOST form)  No data  "MOST" Form in Place?  No data      Disposition  Plan: Will need to be off high flow nasal cannula for at least 24 hours prior to disposition  Consultants:  Critical care specialist signed off  Antibiotics:  Maxipime  Zithromax  Time spent: 28 minutes  [place], [place]

## 2017-01-21 MED ORDER — HYDROCORTISONE ACETATE 25 MG RE SUPP
25.0000 mg | Freq: Three times a day (TID) | RECTAL | Status: DC
  Administered 2017-01-21 – 2017-01-22 (×3): 25 mg via RECTAL
  Filled 2017-01-21 (×5): qty 1

## 2017-01-21 MED ORDER — SODIUM CHLORIDE 0.9 % IV BOLUS (SEPSIS)
500.0000 mL | Freq: Once | INTRAVENOUS | Status: AC
  Administered 2017-01-21: 500 mL via INTRAVENOUS

## 2017-01-21 NOTE — Progress Notes (Signed)
Notified MD of BP of 71/48. Orders received to give 531ml bolus. BP taken after bolus reads 87/59. MD notified, no new orders received. Will continue to monitor. Ammie Dalton, RN

## 2017-01-21 NOTE — Progress Notes (Signed)
Patient ID: Brooke Trevino, female   DOB: 08-08-1935, 81 y.o.   MRN: [id number]   [facility] PROGRESS NOTE  Brooke Trevino OXB:[id number] DOB: 1935-07-06 DOA: 01/14/2017 PCP: Sofie Hartigan, MD  HPI/Subjective: Patient did not sleep last night.  Nurse called me this morning that her blood pressure was low.  Patient still on high flow nasal cannula.  Still with some shortness of breath and cough.  Objective: Vitals:   01/21/17 0727 01/21/17 1037  BP:  (!) 71/48  Pulse:  70  Resp:    Temp:    SpO2: 90%     Filed Weights   01/14/17 1937 01/15/17 0145 01/19/17 0500  Weight: 49.9 kg (110 lb) 55.8 kg (123 lb 0.3 oz) 55.3 kg (121 lb 14.6 oz)    ROS: Review of Systems  Constitutional: Negative for chills and fever.  Eyes: Negative for blurred vision.  Respiratory: Positive for cough and shortness of breath.   Cardiovascular: Negative for chest pain.  Gastrointestinal: Negative for abdominal pain, constipation, diarrhea, nausea and vomiting.  Genitourinary: Negative for dysuria.  Musculoskeletal: Negative for joint pain.  Neurological: Negative for dizziness and headaches.   Exam: Physical Exam  Constitutional: She is oriented to person, place, and time.  HENT:  Nose: No mucosal edema.  Mouth/Throat: No oropharyngeal exudate or posterior oropharyngeal edema.  Eyes: Conjunctivae, EOM and lids are normal. Pupils are equal, round, and reactive to light.  Neck: No JVD present. Carotid bruit is not present. No edema present. No thyroid mass and no thyromegaly present.  Cardiovascular: S1 normal and S2 normal. Exam reveals no gallop.  No murmur heard. Pulses:      Dorsalis pedis pulses are 2+ on the right side, and 2+ on the left side.  Respiratory: No respiratory distress. She has decreased breath sounds in the right lower field, the left middle field and the left lower field. She has no wheezes. She has rhonchi in the left lower field. She has no rales.  GI: Soft.  Bowel sounds are normal. There is no tenderness.  Musculoskeletal:       Right ankle: She exhibits no swelling.       Left ankle: She exhibits no swelling.  Lymphadenopathy:    She has no cervical adenopathy.  Neurological: She is alert and oriented to person, place, and time. No cranial nerve deficit.  Skin: Skin is warm. No rash noted. Nails show no clubbing.  Psychiatric: She has a normal mood and affect.      Data Reviewed: Basic Metabolic Panel: Recent Labs  Lab 01/14/17 2255 01/15/17 0528 01/16/17 0617 01/18/17 0456  NA 136 135 138 141  K 3.8 4.1 4.0 3.8  CL 91* 94* 95* 96*  CO2 34* 32 36* 34*  GLUCOSE 115* 133* 118* 122*  BUN 27* 24* 26* 28*  CREATININE 0.58 0.53 0.59 0.39*  CALCIUM 7.9* 8.1* 8.7* 8.5*   Liver Function Tests: Recent Labs  Lab 01/14/17 2255  AST 24  ALT 16  ALKPHOS 71  BILITOT 0.8  PROT 5.9*  ALBUMIN 2.3*   Recent Labs  Lab 01/14/17 2255  LIPASE 21   CBC: Recent Labs  Lab 01/14/17 2255 01/15/17 0528 01/16/17 0617 01/19/17 0329  WBC 9.3 7.6 7.6 7.5  NEUTROABS 8.3*  --   --   --   HGB 9.8* 9.2* 9.2* 9.2*  HCT 30.5* 28.3* 28.1* 28.9*  MCV 93.8 92.8 92.0 91.9  PLT 290 288 332 408   Cardiac Enzymes: Recent Labs  Lab 01/14/17 2255 01/15/17 0528 01/15/17 1040  TROPONINI 0.04* 0.03* 0.04*   BNP (last 3 results) Recent Labs    12/19/16 1356  BNP 930.0*    CBG: Recent Labs  Lab 01/15/17 0152  GLUCAP 90    Recent Results (from the past 240 hour(s))  Urine culture     Status: None   Collection Time: 01/14/17 10:45 PM  Result Value Ref Range Status   Specimen Description URINE, RANDOM  Final   Special Requests NONE  Final   Culture   Final    NO GROWTH Performed at [facility], [address], [place], [place] [address]    Report Status 01/16/2017 FINAL  Final  CULTURE, BLOOD (ROUTINE X 2) w Reflex to ID Panel     Status: None   Collection Time: 01/14/17 10:57 PM  Result Value Ref Range Status   Specimen  Description BLOOD LEFT ANTECUBITAL  Final   Special Requests   Final    BOTTLES DRAWN AEROBIC AND ANAEROBIC Blood Culture adequate volume   Culture NO GROWTH 5 DAYS  Final   Report Status 01/19/2017 FINAL  Final  CULTURE, BLOOD (ROUTINE X 2) w Reflex to ID Panel     Status: None   Collection Time: 01/14/17 11:13 PM  Result Value Ref Range Status   Specimen Description BLOOD RIGHT ANTECUBITAL  Final   Special Requests   Final    BOTTLES DRAWN AEROBIC AND ANAEROBIC Blood Culture adequate volume   Culture NO GROWTH 5 DAYS  Final   Report Status 01/19/2017 FINAL  Final  MRSA PCR Screening     Status: None   Collection Time: 01/15/17  2:11 AM  Result Value Ref Range Status   MRSA by PCR NEGATIVE NEGATIVE Final    Comment:        The GeneXpert MRSA Assay (FDA approved for NASAL specimens only), is one component of a comprehensive MRSA colonization surveillance program. It is not intended to diagnose MRSA infection nor to guide or monitor treatment for MRSA infections.       Scheduled Meds: . aspirin  81 mg Oral QHS  . budesonide (PULMICORT) nebulizer solution  0.5 mg Nebulization BID  . chlorhexidine  15 mL Mouth Rinse BID  . citalopram  10 mg Oral Daily  . enoxaparin (LOVENOX) injection  40 mg Subcutaneous Q24H  . feeding supplement (ENSURE ENLIVE)  237 mL Oral TID BM  . ipratropium-albuterol  3 mL Nebulization TID  . mouth rinse  15 mL Mouth Rinse q12n4p  . mometasone-formoterol  2 puff Inhalation BID  . multivitamin with minerals  1 tablet Oral Daily  . pravastatin  20 mg Oral QHS  . predniSONE  20 mg Oral Q breakfast  . temazepam  30 mg Oral QHS   Continuous Infusions: . ceFEPime (MAXIPIME) IV 2 g (01/20/17 1723)  . sodium chloride 500 mL (01/21/17 1041)    Assessment/Plan:  1. Acute on chronic hypoxic respiratory failure secondary to COPD exacerbation and pneumonia left lower lobe.  Patient still on high flow nasal cannula 45% oxygen.  Patient is tapered down to  oral steroids.  Patient on IV Maxipime, Zithromax.  Pro-calcitonin on 11/5 was elevated at 2.84.  Chest x-ray shows a dense consolidation left lower lobe. 2. Hypotension.  Hold Lasix and low-dose lisinopril.  Give a fluid bolus. 3. Chronic systolic congestive heart failure.  Off meds at this time secondary to hypotension 4. Known multiple lung lesions treated empirically for presumed non-small  cell lung cancer 5. Hyperlipidemia unspecified on pravastatin 6. Depression on Celexa 7. Weakness.  Physical therapy recommended 24-hour supervision 8. Patient is a DNR  Code Status:     Code Status Orders  (From admission, onward)        Start     Ordered   01/15/17 0212  Do not attempt resuscitation (DNR)  Continuous    Question Answer Comment  In the event of cardiac or respiratory ARREST Do not call a "code blue"   In the event of cardiac or respiratory ARREST Do not perform Intubation, CPR, defibrillation or ACLS   In the event of cardiac or respiratory ARREST Use medication by any route, position, wound care, and other measures to relive pain and suffering. May use oxygen, suction and manual treatment of airway obstruction as needed for comfort.      01/15/17 8315    Code Status History    Date Active Date Inactive Code Status Order ID Comments User Context   12/25/2016 11:24 12/31/2016 15:12 DNR [id number]  Wilhelmina Mcardle, MD Inpatient   12/19/2016 17:51 12/25/2016 11:24 Full Code [id number]  Fritzi Mandes, MD Inpatient   08/09/2015 16:45 08/13/2015 17:49 Full Code [id number]  Bettey Costa, MD ED    Advance Directive Documentation     Most Recent Value  Type of Advance Directive  Out of facility DNR (pink MOST or yellow form)  Pre-existing out of facility DNR order (yellow form or pink MOST form)  No data  "MOST" Form in Place?  No data      Disposition Plan: Will need to be off high flow nasal cannula for at least 24 hours prior to disposition  Consultants:  Critical care specialist  signed off  Antibiotics:  Maxipime  Zithromax  Time spent: 28 minutes Tried to call husband but the home phone number listed in the computer is wrong phone number.  The husband cell phone does not even have a dial tone some assuming this is a wrong number also.  Loletha Grayer  [employer]

## 2017-01-22 LAB — CREATININE, SERUM
Creatinine, Ser: 0.61 mg/dL (ref 0.44–1.00)
GFR calc Af Amer: >60 mL/min (ref >60)
GFR calc non Af Amer: >60 mL/min (ref >60)

## 2017-01-22 MED ORDER — ALPRAZOLAM 0.25 MG PO TABS: 0.2500 mg | ORAL_TABLET | Freq: Three times a day (TID) | ORAL | 0 refills | Status: AC | PRN

## 2017-01-22 MED ORDER — NYSTATIN 100000 UNIT/ML MT SUSP: 5.0000 mL | Freq: Four times a day (QID) | OROMUCOSAL | 0 refills | Status: AC

## 2017-01-22 MED ORDER — HYDROCORTISONE ACETATE 25 MG RE SUPP: 25.0000 mg | Freq: Three times a day (TID) | RECTAL | 0 refills | Status: AC

## 2017-01-22 MED ORDER — AMOXICILLIN-POT CLAVULANATE 500-125 MG PO TABS
1.0000 | ORAL_TABLET | Freq: Two times a day (BID) | ORAL | Status: DC
  Filled 2017-01-22: qty 1

## 2017-01-22 MED ORDER — ALPRAZOLAM 0.5 MG PO TABS: 0.2500 mg | ORAL_TABLET | Freq: Three times a day (TID) | ORAL | Status: DC | PRN

## 2017-01-22 MED ORDER — AMOXICILLIN-POT CLAVULANATE 500-125 MG PO TABS: 1.0000 | ORAL_TABLET | Freq: Two times a day (BID) | ORAL | 0 refills | Status: AC

## 2017-01-22 MED ORDER — NYSTATIN 100000 UNIT/ML MT SUSP: 5.0000 mL | Freq: Four times a day (QID) | OROMUCOSAL | Status: DC

## 2017-01-22 MED ORDER — MORPHINE SULFATE (CONCENTRATE) 10 MG/0.5ML PO SOLN: 10.0000 mg | ORAL | 0 refills | Status: AC | PRN

## 2017-01-22 NOTE — Discharge Summary (Signed)
[facility] NAME: Brooke Trevino    MR#:  [id number]  DATE OF BIRTH:  Aug 04, 1935  DATE OF ADMISSION:  01/14/2017 ADMITTING PHYSICIAN: Lance Coon, MD  DATE OF DISCHARGE: 01/22/2017  PRIMARY CARE PHYSICIAN: Sofie Hartigan, MD    ADMISSION DIAGNOSIS:  Respiratory distress [R06.03] Acute respiratory failure with hypoxia ([place]) [J96.01]  DISCHARGE DIAGNOSIS:  Principal Problem:   Acute on chronic respiratory failure with hypoxia (HCC) Active Problems:   COPD exacerbation (HCC)   Essential hypertension, benign   Chronic systolic CHF (congestive heart failure) ([place])   SECONDARY DIAGNOSIS:   Past Medical History:  Diagnosis Date  . CAD (coronary artery disease)   . Emphysema lung ([place])   . History of COPD   . HLD (hyperlipidemia)   . HTN (hypertension)     HOSPITAL COURSE:   1.  Acute respiratory distress, acute on chronic respiratory failure with hypoxia, pneumonia left lower lobe.  The patient spent most of the hospital stay on BiPAP and/or high flow nasal cannula.  On 01/22/2017 the patient states that she has to go home today.  I switched her back to her normal nasal cannula.  Goal pulse ox is greater than 86%.  The patient already has hospice at home.  Prescription written for Roxanol and Xanax.  Anxiety does play a role with her respiratory status.  Pro-calcitonin was elevated during the hospital course.  She finished a course of Zithromax and was on Maxipime while here.  I will give a few more days of Augmentin upon going home.  Chest x-ray showed dense consolidation of the left lower lobe. 2.  Hypotension.  I tried restarting her cardiac meds and she developed hypotension.  Continue to hold Lasix lisinopril and beta-blocker at this time 3.  Chronic systolic congestive heart failure.  No signs during the hospital course.  Actually been off medications with hypotension for most of the hospital course. 4.  Known multiple  lung lesions treated empirically for presumed non-small cell lung cancer 5.  Hyperlipidemia unspecified on pravastatin 6.  Depression on Celexa 7.  Weakness.  Physical therapy recommended 24-hour supervision 8.  Patient is a DO NOT RESUSCITATE 9.  Thrush seen in the mouth nystatin swish and swallow 10.  Hemorrhoids Anusol suppositories 11.  Anemia follow-up as outpatient  DISCHARGE CONDITIONS:   Guarded  CONSULTS OBTAINED:  Treatment Team:  Flora Lipps, MD  DRUG ALLERGIES:  No Known Allergies  DISCHARGE MEDICATIONS:   Current Discharge Medication List    START taking these medications   Details  ALPRAZolam (XANAX) 0.25 MG tablet Take 1 tablet (0.25 mg total) 3 (three) times daily as needed by mouth for anxiety. Qty: 30 tablet, Refills: 0    amoxicillin-clavulanate (AUGMENTIN) 500-125 MG tablet Take 1 tablet (500 mg total) 2 (two) times daily by mouth. Qty: 7 tablet, Refills: 0    hydrocortisone (ANUSOL-HC) 25 MG suppository Place 1 suppository (25 mg total) 3 (three) times daily rectally. Qty: 30 suppository, Refills: 0    nystatin (MYCOSTATIN) 100000 UNIT/ML suspension Take 5 mLs (500,000 Units total) 4 (four) times daily by mouth. Qty: 60 mL, Refills: 0      CONTINUE these medications which have CHANGED   Details  Morphine Sulfate (MORPHINE CONCENTRATE) 10 MG/0.5ML SOLN concentrated solution Place 0.5 mLs (10 mg total) every 2 (two) hours as needed under the tongue for shortness of breath. Qty: 30 mL, Refills: 0      CONTINUE these medications  which have NOT CHANGED   Details  aspirin 81 MG chewable tablet Chew 81 mg by mouth at bedtime.    calcium carbonate (TUMS - DOSED IN MG ELEMENTAL CALCIUM) 500 MG chewable tablet Chew 1 tablet by mouth as needed for indigestion or heartburn.    citalopram (CELEXA) 10 MG tablet Take 10 mg by mouth daily.    Fluticasone-Salmeterol (ADVAIR) 250-50 MCG/DOSE AEPB Inhale 1 puff into the lungs 2 (two) times daily.     metoCLOPramide (REGLAN) 10 MG tablet Take 10 mg 3 (three) times daily by mouth.    montelukast (SINGULAIR) 10 MG tablet Take 10 mg by mouth at bedtime.    pravastatin (PRAVACHOL) 20 MG tablet Take 20 mg by mouth at bedtime.    predniSONE (DELTASONE) 20 MG tablet Take 1 tablet (20 mg total) by mouth daily with breakfast. Qty: 3 tablet, Refills: 0    temazepam (RESTORIL) 30 MG capsule Take 30 mg by mouth at bedtime as needed for sleep.    feeding supplement, ENSURE ENLIVE, (ENSURE ENLIVE) LIQD Take 237 mLs by mouth 3 (three) times daily between meals. Qty: 237 mL, Refills: 12    ipratropium-albuterol (DUONEB) 0.5-2.5 (3) MG/3ML SOLN Take 3 mLs by nebulization every 4 (four) hours.      STOP taking these medications     carvedilol (COREG) 6.25 MG tablet      furosemide (LASIX) 40 MG tablet      lisinopril (PRINIVIL,ZESTRIL) 10 MG tablet      meloxicam (MOBIC) 7.5 MG tablet      potassium chloride SA (K-DUR,KLOR-CON) 20 MEQ tablet      albuterol (PROVENTIL) (2.5 MG/3ML) 0.083% nebulizer solution          DISCHARGE INSTRUCTIONS:   Follow-up with hospice at home Follow-up PMD 1 week  If you experience worsening of your admission symptoms, develop shortness of breath, life threatening emergency, suicidal or homicidal thoughts you must seek medical attention immediately by calling 911 or calling your MD immediately  if symptoms less severe.  You Must read complete instructions/literature along with all the possible adverse reactions/side effects for all the Medicines you take and that have been prescribed to you. Take any new Medicines after you have completely understood and accept all the possible adverse reactions/side effects.   Please note  You were cared for by a hospitalist during your hospital stay. If you have any questions about your discharge medications or the care you received while you were in the hospital after you are discharged, you can call the unit and asked  to speak with the hospitalist on call if the hospitalist that took care of you is not available. Once you are discharged, your primary care physician will handle any further medical issues. Please note that NO REFILLS for any discharge medications will be authorized once you are discharged, as it is imperative that you return to your primary care physician (or establish a relationship with a primary care physician if you do not have one) for your aftercare needs so that they can reassess your need for medications and monitor your lab values.    Today   CHIEF COMPLAINT:   Chief Complaint  Patient presents with  . Respiratory Distress    HISTORY OF PRESENT ILLNESS:  Brooke Trevino  is a 81 y.o. female presented with respiratory distress   VITAL SIGNS:  Blood pressure 111/66, pulse (!) 120, temperature 98.8 F (37.1 C), temperature source Oral, resp. rate 20, height 5\' 4"  (1.626 m), weight  55.3 kg (121 lb 14.6 oz), SpO2 (!) 87 %. Heart rate prior to this was 85.  Patient anxious to go home   PHYSICAL EXAMINATION:  GENERAL:  81 y.o.-year-old patient lying in the bed with no acute distress.  EYES: Pupils equal, round, reactive to light and accommodation. No scleral icterus. Extraocular muscles intact.  HEENT: Head atraumatic, normocephalic. Oropharynx and nasopharynx clear.  NECK:  Supple, no jugular venous distention. No thyroid enlargement, no tenderness.  LUNGS: Decreased breath sounds bilaterally, no wheezing, rales,rhonchi or crepitation. No use of accessory muscles of respiration.  CARDIOVASCULAR: S1, S2 normal. No murmurs, rubs, or gallops.  ABDOMEN: Soft, non-tender, non-distended. Bowel sounds present. No organomegaly or mass.  EXTREMITIES: No pedal edema, cyanosis, or clubbing.  NEUROLOGIC: Cranial nerves II through XII are intact. Muscle strength 5/5 in all extremities. Sensation intact. Gait not checked.  PSYCHIATRIC: The patient is alert and oriented x 3.  SKIN: No obvious  rash, lesion, or ulcer.   DATA REVIEW:   CBC Recent Labs  Lab 01/19/17 0329  WBC 7.5  HGB 9.2*  HCT 28.9*  PLT 408    Chemistries  Recent Labs  Lab 01/18/17 0456 01/22/17 0540  NA 141  --   K 3.8  --   CL 96*  --   CO2 34*  --   GLUCOSE 122*  --   BUN 28*  --   CREATININE 0.39* 0.61  CALCIUM 8.5*  --      Microbiology Results  Results for orders placed or performed during the hospital encounter of 01/14/17  Urine culture     Status: None   Collection Time: 01/14/17 10:45 PM  Result Value Ref Range Status   Specimen Description URINE, RANDOM  Final   Special Requests NONE  Final   Culture   Final    NO GROWTH Performed at [facility], [place] [address], [place], [place] [address]    Report Status 01/16/2017 FINAL  Final  CULTURE, BLOOD (ROUTINE X 2) w Reflex to ID Panel     Status: None   Collection Time: 01/14/17 10:57 PM  Result Value Ref Range Status   Specimen Description BLOOD LEFT ANTECUBITAL  Final   Special Requests   Final    BOTTLES DRAWN AEROBIC AND ANAEROBIC Blood Culture adequate volume   Culture NO GROWTH 5 DAYS  Final   Report Status 01/19/2017 FINAL  Final  CULTURE, BLOOD (ROUTINE X 2) w Reflex to ID Panel     Status: None   Collection Time: 01/14/17 11:13 PM  Result Value Ref Range Status   Specimen Description BLOOD RIGHT ANTECUBITAL  Final   Special Requests   Final    BOTTLES DRAWN AEROBIC AND ANAEROBIC Blood Culture adequate volume   Culture NO GROWTH 5 DAYS  Final   Report Status 01/19/2017 FINAL  Final  MRSA PCR Screening     Status: None   Collection Time: 01/15/17  2:11 AM  Result Value Ref Range Status   MRSA by PCR NEGATIVE NEGATIVE Final    Comment:        The GeneXpert MRSA Assay (FDA approved for NASAL specimens only), is one component of a comprehensive MRSA colonization surveillance program. It is not intended to diagnose MRSA infection nor to guide or monitor treatment for MRSA infections.         Management plans discussed with the patient, family and they are in agreement.  CODE STATUS:     Code Status Orders  (  From admission, onward)        Start     Ordered   01/15/17 0212  Do not attempt resuscitation (DNR)  Continuous    Question Answer Comment  In the event of cardiac or respiratory ARREST Do not call a "code blue"   In the event of cardiac or respiratory ARREST Do not perform Intubation, CPR, defibrillation or ACLS   In the event of cardiac or respiratory ARREST Use medication by any route, position, wound care, and other measures to relive pain and suffering. May use oxygen, suction and manual treatment of airway obstruction as needed for comfort.      01/15/17 0962    Code Status History    Date Active Date Inactive Code Status Order ID Comments User Context   12/25/2016 11:24 12/31/2016 15:12 DNR [id number]  Wilhelmina Mcardle, MD Inpatient   12/19/2016 17:51 12/25/2016 11:24 Full Code [id number]  Fritzi Mandes, MD Inpatient   08/09/2015 16:45 08/13/2015 17:49 Full Code [id number]  Bettey Costa, MD ED    Advance Directive Documentation     Most Recent Value  Type of Advance Directive  Out of facility DNR (pink MOST or yellow form)  Pre-existing out of facility DNR order (yellow form or pink MOST form)  No data  "MOST" Form in Place?  No data      TOTAL TIME TAKING CARE OF THIS PATIENT: 45 minutes.    Loletha Grayer M.D on 01/22/2017 at 3:19 PM  Between 7am to 6pm - Pager - [phone]  After 6pm go to [email or web address] - password [email or web address]  [facility] Office  [phone]  CC: Primary care physician; Sofie Hartigan, MD

## 2017-01-22 NOTE — Progress Notes (Signed)
Visit made, patient seen sitting up in bed, appeared some what disoriented. Patient is now on nasal cannula at 4 liters, humidified during visit. Patient reports that she will be discharging home, confirmed with attendng physician Dt. Scientist, research (medical). Patient very much wants to go home. Hospice Team updated to discharge. Patient will discharge with signed DNR and prescription for liquid morphine and xanax per discussion with Dr. Leslye Peer. Husband to bring p[ortable oxygen tank for transport home.  [name] RN, BSN, [facility] [place], hospital Liaison [phone] c

## 2017-01-22 NOTE — Progress Notes (Signed)
While rounding with Dr. Leslye Peer this morning pt was very agitated, anxious about still being in the hospital still as she is used to being very independent at home. Verbal order to take pt off HiFlow oxygen to pt 4L nasal cannula in preparation to get patient ready for discharge. Foley catheter removed by student nurse Delle Reining- will monitor for urine output.

## 2017-01-22 NOTE — Plan of Care (Signed)
Pt has tolerated transition from HiFlow nasal cannula to chronic 4L nasal cannula without problem since 0900am this morning. Liquid Morphine given x1 for SOB due to anxiety which helped. Dr. Leslye Peer aware of pt progress.

## 2017-01-22 NOTE — Plan of Care (Signed)
Pt remains on Hi-Flow oxygen.

## 2017-01-22 NOTE — Progress Notes (Signed)
Pharmacy Antibiotic Note  Brooke Trevino is a 81 y.o. female admitted on 01/14/2017 with pneumonia and acute respiratory failure.  Pharmacy has been consulted for cefepime dosing. Patient was also started on azithromycin for atypical coverage.   Plan: Continue Cefepime 2g IV Q24hr for a total of 8 days - last dose today Pharmacy will continue to follow and adjust as needed.     Height: 5\' 4"  (162.6 cm) Weight: 121 lb 14.6 oz (55.3 kg) IBW/kg (Calculated) : 54.7  Temp (24hrs), Avg:98.4 F (36.9 C), Min:98.1 F (36.7 C), Max:98.6 F (37 C)  Recent Labs  Lab 01/16/17 0617 01/18/17 0456 01/19/17 0329 01/22/17 0540  WBC 7.6  --  7.5  --   CREATININE 0.59 0.39*  --  0.61    Estimated Creatinine Clearance: 49.2 mL/min (by C-G formula based on SCr of 0.61 mg/dL).    No Known Allergies  Antimicrobials this admission: Vancomycin 11/4 >> 11/5 Cefepime 11/4 >> Azithromycin 11/6 >> 11/11  Dose adjustments this admission: N/A  Microbiology results: 11/4 BCx: NG 11/4 UCx: NG 11/5 MRSA PCR: negative  Procalcitonin: 1.55 >> 2.84   Thank you for allowing pharmacy to be a part of this patient's care.  Rayna Sexton, PharmD, BCPS Clinical Pharmacist 01/22/2017 8:34 AM

## 2017-01-22 NOTE — Discharge Instructions (Signed)
Acute Respiratory Failure, Adult Acute respiratory failure occurs when there is not enough oxygen passing from your lungs to your body. When this happens, your lungs have trouble removing carbon dioxide from the blood. This causes your blood oxygen level to drop too low as carbon dioxide builds up. Acute respiratory failure is a medical emergency. It can develop quickly, but it is temporary if treated promptly. Your lung capacity, or how much air your lungs can hold, may improve with time, exercise, and treatment. What are the causes? There are many possible causes of acute respiratory failure, including:  Lung injury.  Chest injury or damage to the ribs or tissues near the lungs.  Lung conditions that affect the flow of air and blood into and out of the lungs, such as pneumonia, acute respiratory distress syndrome, and cystic fibrosis.  Medical conditions, such as strokes or spinal cord injuries, that affect the muscles and nerves that control breathing.  Blood infection (sepsis).  Inflammation of the pancreas (pancreatitis).  A blood clot in the lungs (pulmonary embolism).  A large-volume blood transfusion.  Burns.  Near-drowning.  Seizure.  Smoke inhalation.  Reaction to medicines.  Alcohol or drug overdose.  What increases the risk? This condition is more likely to develop in people who have:  A blocked airway.  Asthma.  A condition or disease that damages or weakens the muscles, nerves, bones, or tissues that are involved in breathing.  A serious infection.  A health problem that blocks the unconscious reflex that is involved in breathing, such as hypothyroidism or sleep apnea.  A lung injury or trauma.  What are the signs or symptoms? Trouble breathing is the main symptom of acute respiratory failure. Symptoms may also include:  Rapid breathing.  Restlessness or anxiety.  Skin, lips, or fingernails that appear blue (cyanosis).  Rapid heart  rate.  Abnormal heart rhythms (arrhythmias).  Confusion or changes in behavior.  Tiredness or loss of energy.  Feeling sleepy or having a loss of consciousness.  How is this diagnosed? Your health care provider can diagnose acute respiratory failure with a medical history and physical exam. During the exam, your health care provider will listen to your heart and check for crackling or wheezing sounds in your lungs. Your may also have tests to confirm the diagnosis and determine what is causing respiratory failure. These tests may include:  Measuring the amount of oxygen in your blood (pulse oximetry). The measurement comes from a small device that is placed on your finger, earlobe, or toe.  Other blood tests to measure blood gases and to look for signs of infection.  Sampling your cerebral spinal fluid or tracheal fluid to check for infections.  Chest X-ray to look for fluid in spaces that should be filled with air.  Electrocardiogram (ECG) to look at the heart's electrical activity.  How is this treated? Treatment for this condition usually takes places in a hospital intensive care unit (ICU). Treatment depends on what is causing the condition. It may include one or more treatments until your symptoms improve. Treatment may include:  Supplemental oxygen. Extra oxygen is given through a tube in the nose, a face mask, or a hood.  A device such as a continuous positive airway pressure (CPAP) or bi-level positive airway pressure (BiPAP or BPAP) machine. This treatment uses mild air pressure to keep the airways open. A mask or other device will be placed over your nose or mouth. A tube that is connected to a motor will deliver  oxygen through the mask.  Ventilator. This treatment helps move air into and out of the lungs. This may be done with a bag and mask or a machine. For this treatment, a tube is placed in your windpipe (trachea) so air and oxygen can flow to the lungs.  Extracorporeal  membrane oxygenation (ECMO). This treatment temporarily takes over the function of the heart and lungs, supplying oxygen and removing carbon dioxide. ECMO gives the lungs a chance to recover. It may be used if a ventilator is not effective.  Tracheostomy. This is a procedure that creates a hole in the neck to insert a breathing tube.  Receiving fluids and medicines.  Rocking the bed to help breathing.  Follow these instructions at home:  Take over-the-counter and prescription medicines only as told by your health care provider.  Return to normal activities as told by your health care provider. Ask your health care provider what activities are safe for you.  Keep all follow-up visits as told by your health care provider. This is important. How is this prevented? Treating infections and medical conditions that may lead to acute respiratory failure can help prevent the condition from developing. Contact a health care provider if:  You have a fever.  Your symptoms do not improve or they get worse. Get help right away if:  You are having trouble breathing.  You lose consciousness.  Your have cyanosis or turn blue.  You develop a rapid heart rate.  You are confused. These symptoms may represent a serious problem that is an emergency. Do not wait to see if the symptoms will go away. Get medical help right away. Call your local emergency services (911 in the [place] Do not drive yourself to the hospital. This information is not intended to replace advice given to you by your health care provider. Make sure you discuss any questions you have with your health care provider. Document Released: 03/04/2013 Document Revised: 09/25/2015 Document Reviewed: 09/15/2015 Elsevier Interactive Patient Education  [employer].

## 2017-01-22 NOTE — Progress Notes (Signed)
Discharge paperwork reviewed with patient and husband who verbalized understanding. Prescriptions attached to paperwork. DNR in packet. Patient is stable on 4L O2, home oxygen tank at bedside. Patient's husband to transport home. Hospice liaison, [name] RN, aware of pt discharge to resume home hospice services.

## 2017-02-10 DEATH — deceased

## 2017-11-06 ENCOUNTER — Other Ambulatory Visit (INDEPENDENT_AMBULATORY_CARE_PROVIDER_SITE_OTHER): Payer: 59

## 2017-11-06 ENCOUNTER — Ambulatory Visit (INDEPENDENT_AMBULATORY_CARE_PROVIDER_SITE_OTHER): Payer: Medicare Other | Admitting: Vascular Surgery

## 2019-05-20 IMAGING — DX DG CHEST 1V PORT
1 series · 1 of 1 positions shown · non-contrast
Comparison: 09/25/2016

CLINICAL DATA: Pt arrives [facility] emergency traffic from home for
respiratory distress. Hx COPD. Pt took 2 duonebs at home, 2 puffs of
2 different inahlers (one known to be albuterol). Pt wears 3.5 L
chronically. Was 72% on her 3.5 L. EMS placed pt on bipap, states pt
has never been on bipap. Pt has lung masses in each lung. Is being
treated for R sided mass. EMS reports expiratory wheezes and
diminished on L side. EMS states pt wasn't able to talk much upon
their arrival at home. EMS arrives talking in fragments. RT placed
pt on [HOSP[facility]] bipap upon arrival. EMS reports pt has oncologist here at
[HOSPITAL].was truncated*

EXAM:
PORTABLE CHEST 1 VIEW

[chest ap]
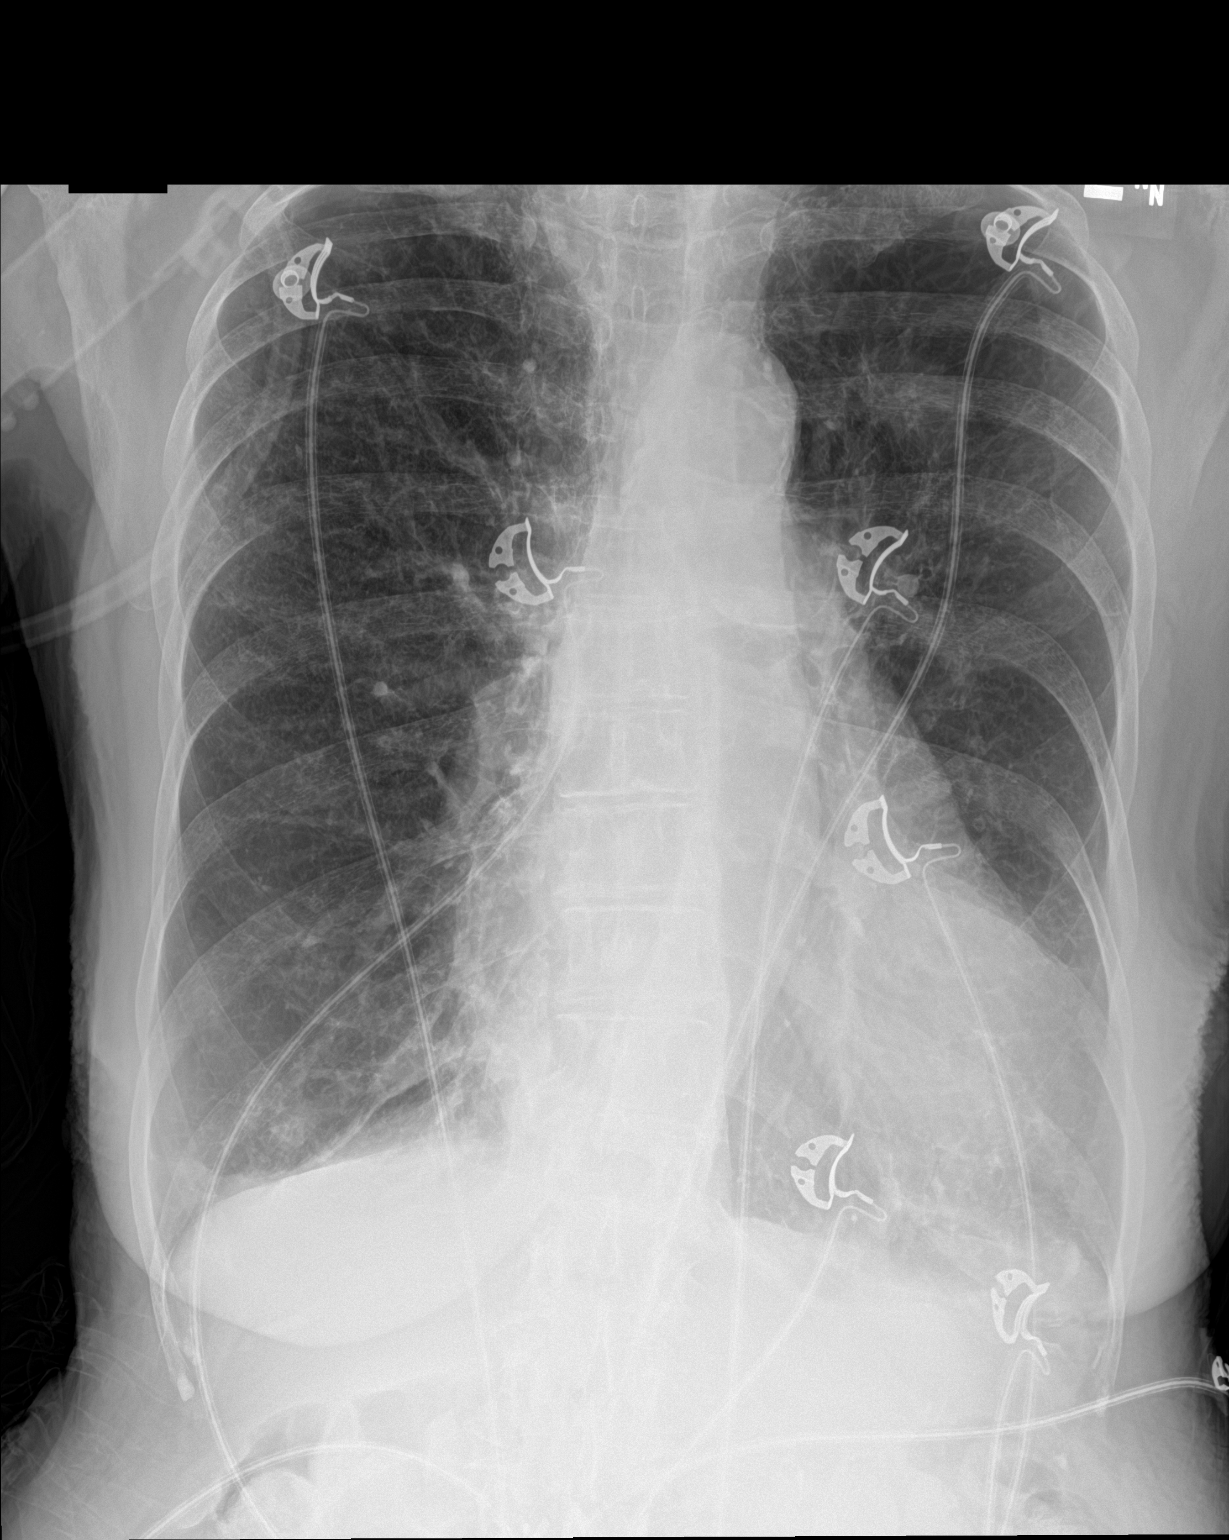

[1 of 1 positions shown; findings below may reference images not displayed]

FINDINGS: Mild enlargement of the cardiopericardial silhouette.
Atherosclerotic calcification of the aortic arch. Emphysema noted.

Prior left upper lobe mass is still present but is less striking
compared to the prior exam. The other pulmonary nodules are not
readily apparent on conventional radiography.

Edema is apparent.
IMPRESSION: 1. Reduced size of left upper lobe mass.
2. Mild enlargement of the cardiopericardial silhouette. No overt
edema, although the presence of the severe emphysema can mask
pulmonary edema.
3.  Aortic Atherosclerosis ([id number]-[id number]).

## 2019-05-23 IMAGING — DX DG CHEST 1V PORT
1 series · 1 of 1 positions shown · non-contrast
Comparison: PET-CT 09/25/2016, chest x-ray 12/19/2016

CLINICAL DATA: 81-year-old female with a history of respiratory
failure

EXAM:
PORTABLE CHEST 1 VIEW

[chest ap]
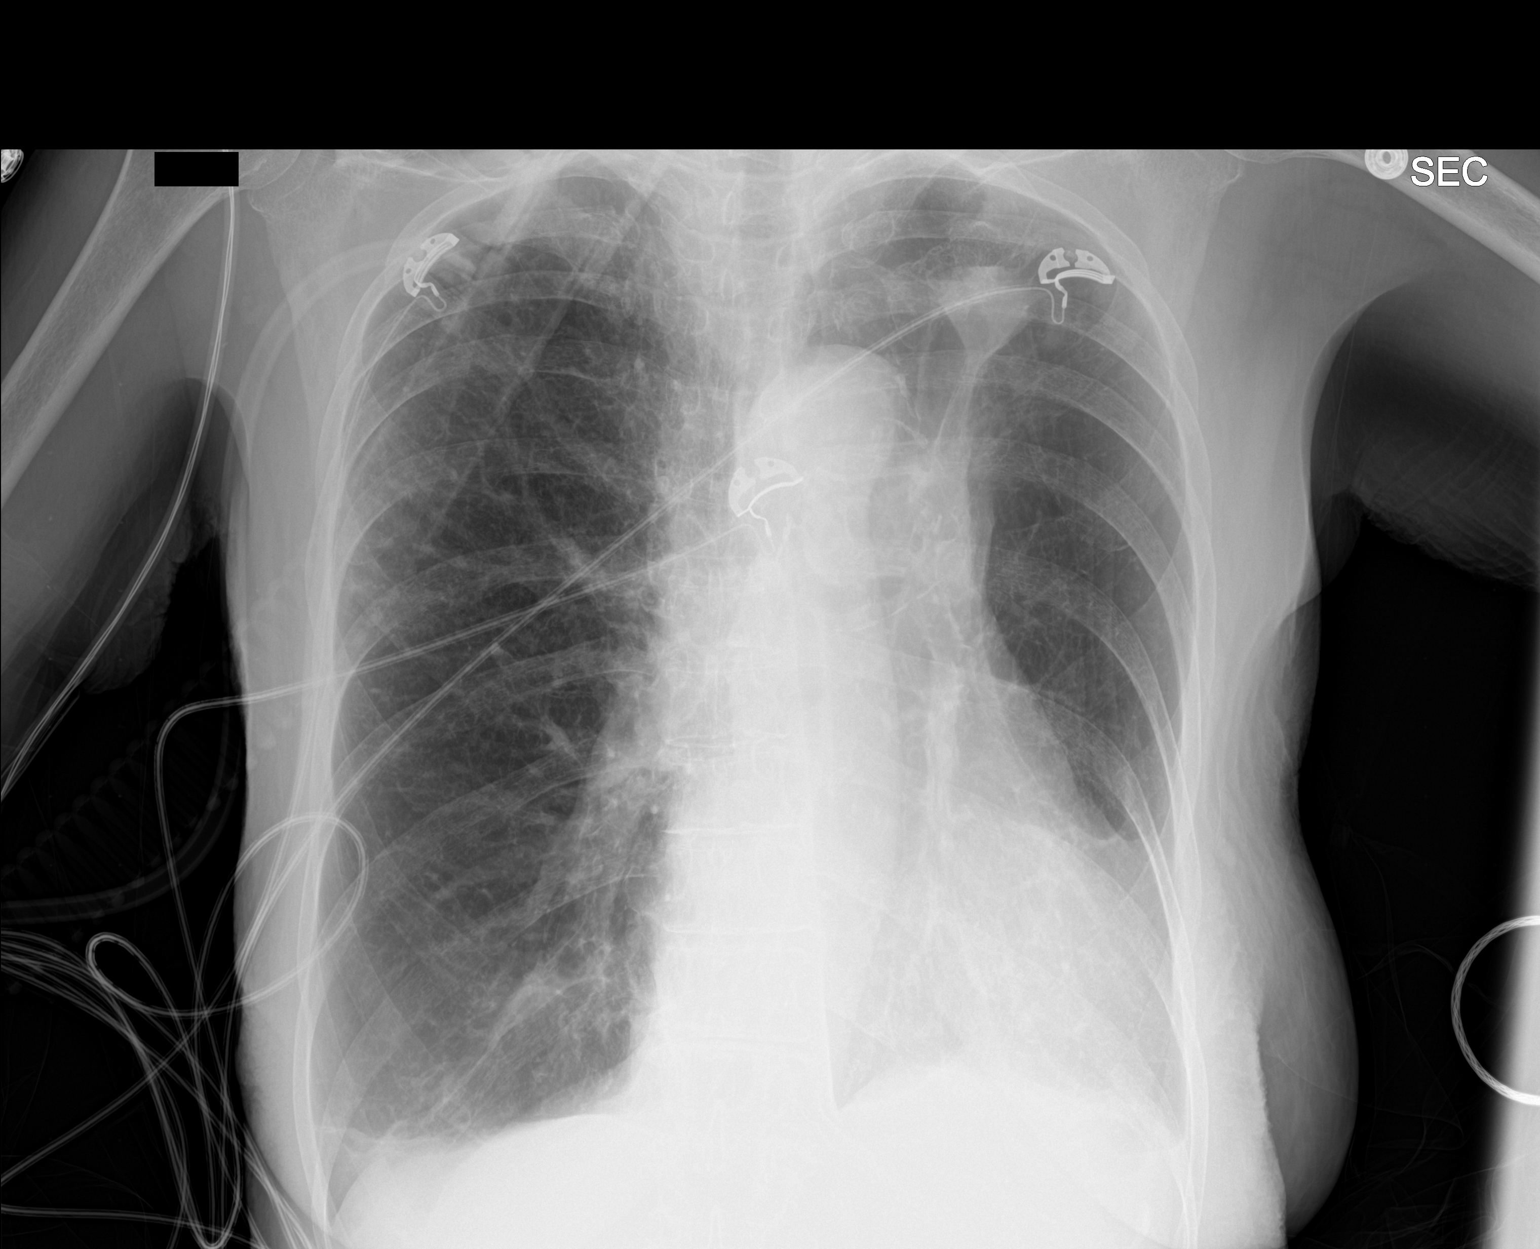

[1 of 1 positions shown; findings below may reference images not displayed]

FINDINGS: Cardiomediastinal silhouette unchanged in size and contour with the
left heart borders partially obscured by lung and pleural disease.

Masslike opacity of the left upper lobe in the region of a prior
cavitary lesion, with linear extension towards the left heart border
and hilar region. The right-sided FDG avid nodules on prior PET-CT
are not well visualized on the current plain film.

Opacity at the left base partially obscuring the left hemidiaphragm
and left heart border with blunting of the costophrenic angle.

Coarsened interstitial markings on the right.

No pneumothorax.
IMPRESSION: Recurrent left upper lobe mass in this patient with a prior cavitary
lesion at this location is concerning for recurrent
infection/inflammation, or potentially post treatment changes/scar.
Further evaluation with contrast-enhanced chest CT recommended.

Opacity at the left lung base may reflect combination of pleural
effusion, atelectasis, and/ or treatment changes.

Previously identified FDG avid nodules of the right lung are not as
well visualized on the current study.

## 2019-05-27 IMAGING — DX DG CHEST 1V PORT
1 series · 1 of 1 positions shown · non-contrast
Comparison: Portable chest x-ray December 26, 2016

CLINICAL DATA: Respiratory failure and pneumothorax. History of
COPD, former smoker, acute on chronic respiratory failure, CHF.

EXAM:
PORTABLE CHEST 1 VIEW

[chest ap]
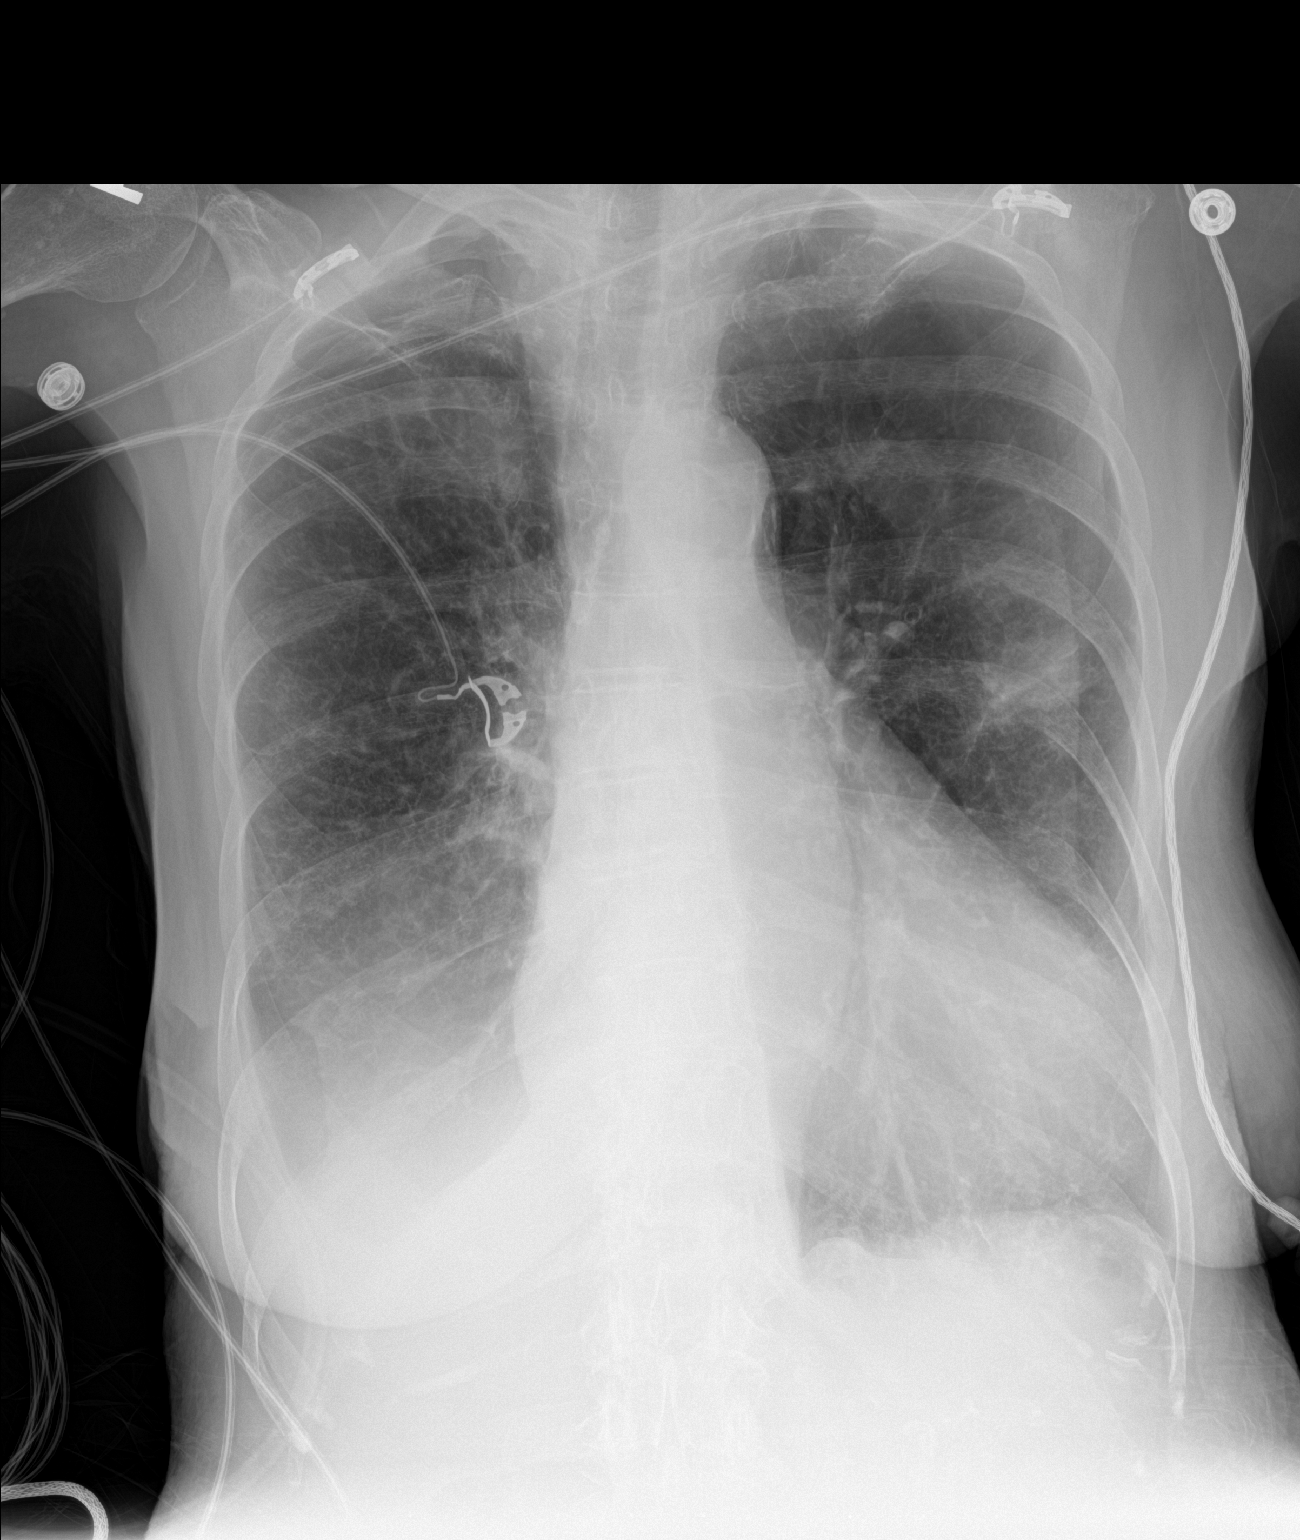

[1 of 1 positions shown; findings below may reference images not displayed]

FINDINGS: There is a stable appearing approximately 10% right apical
pneumothorax. There remains a posterior layering right pleural
effusion. There is no mediastinal shift. The left lung is
hyperinflated. There is patchy alveolar opacity in the mid lung
which is stable. The heart is mildly enlarged but stable. The
pulmonary vascularity is normal. There is calcification in the wall
of the aortic arch.
IMPRESSION: Stable approximately 10% right apical pneumothorax. Stable right
pleural effusion. Stable alveolar opacity in the left mid lung.
Underlying COPD.

Mild cardiomegaly without pulmonary edema. Thoracic aortic
atherosclerosis.

## 2019-05-27 IMAGING — DX DG CHEST 1V PORT
1 series · 2 of 2 positions shown · non-contrast
Comparison: 12/25/2016 chest x-ray.  09/25/2016 PET CT.

CLINICAL DATA: 81-year-old female with respiratory failure.
Subsequent encounter.

EXAM:
PORTABLE CHEST 1 VIEW

[Series 1: chest ap · 0.14mm/px · 2 of 2 slices shown]
[im 1/2]
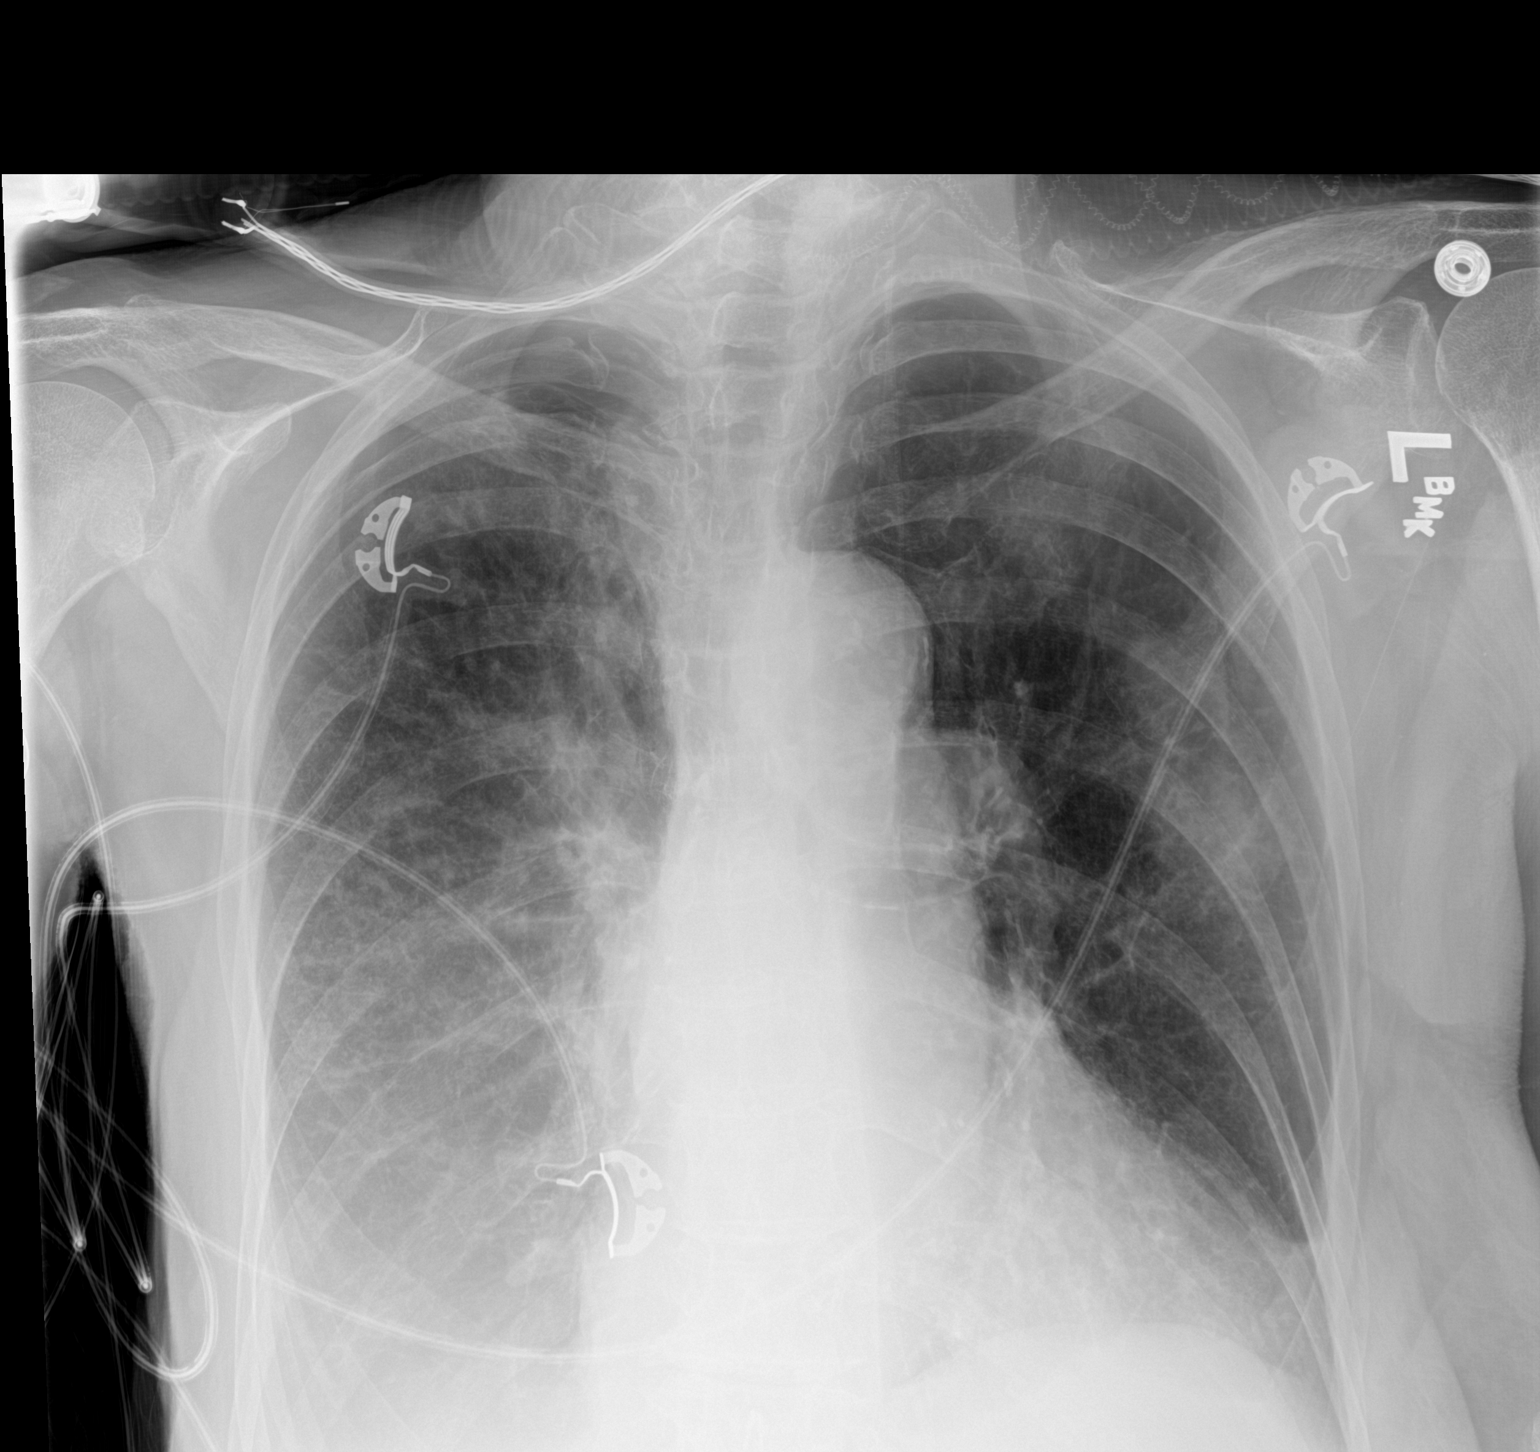
[im 2/2]
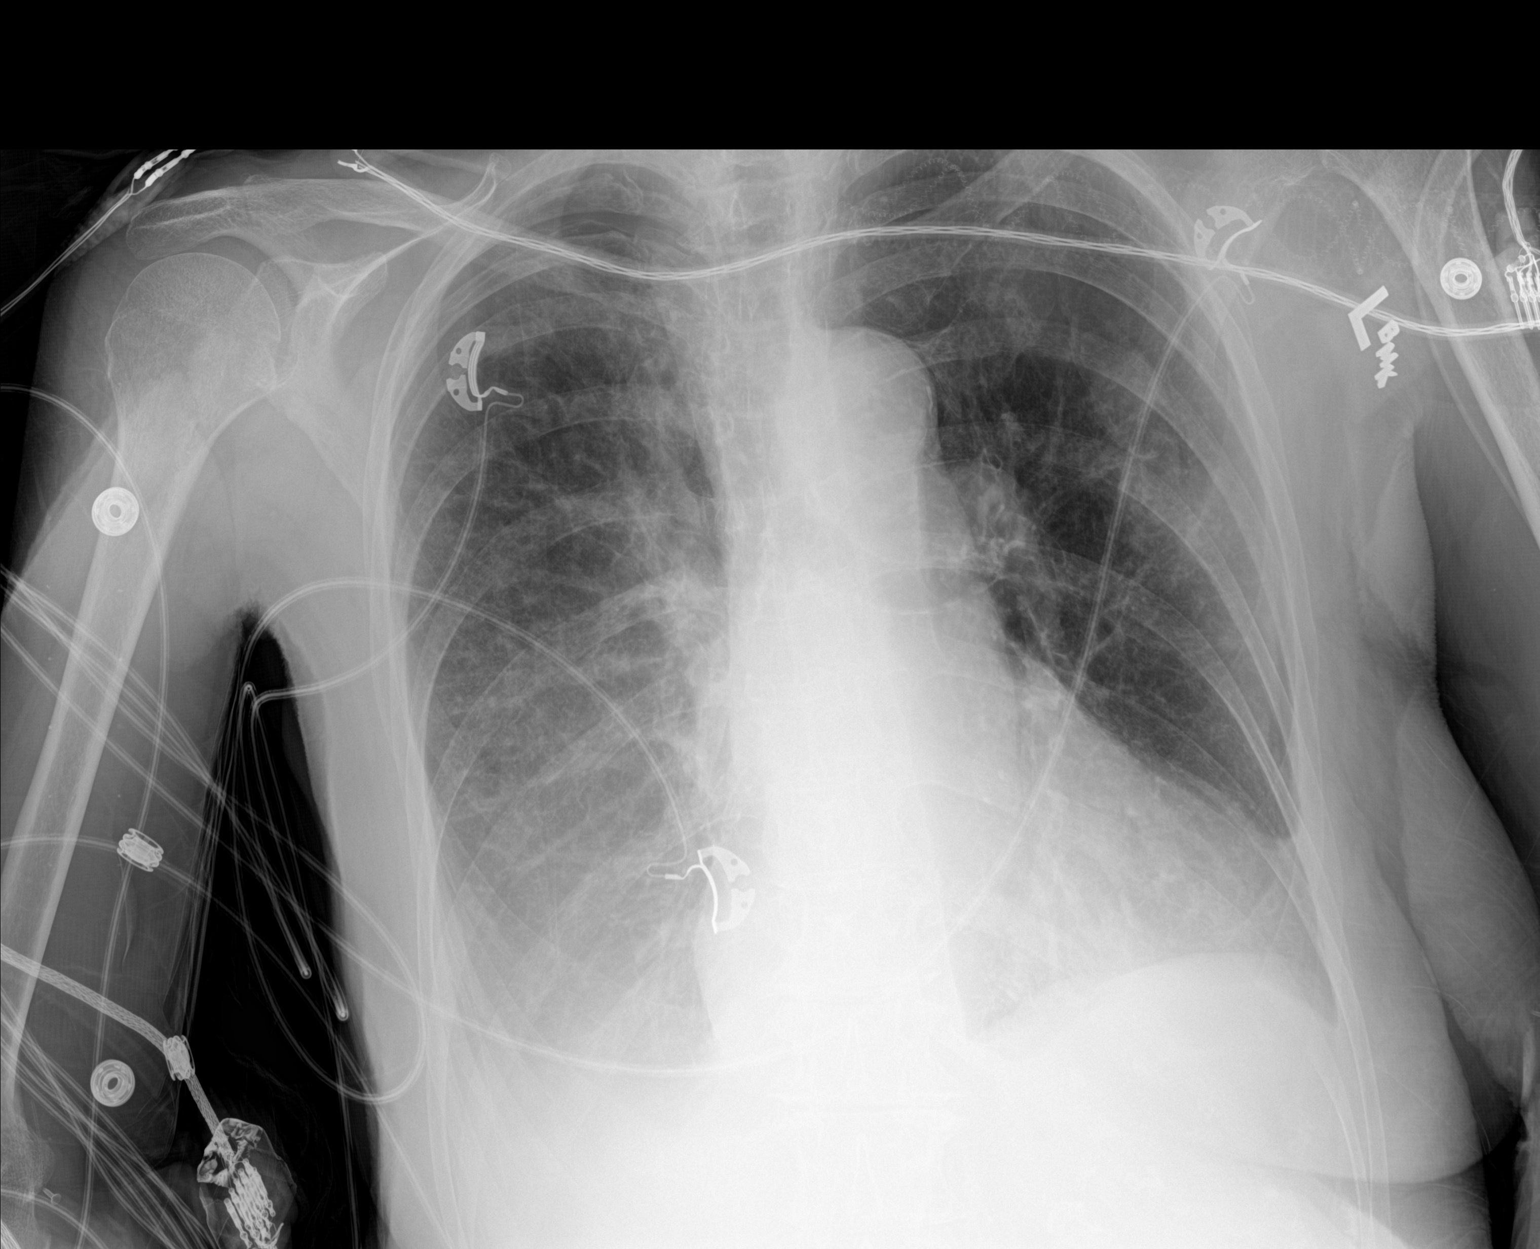

[2 of 2 positions shown; findings below may reference images not displayed]

FINDINGS: Small right apical pneumothorax (approximately 5-10%).

Posteriorly layering right-sided pleural effusion.

PET CT detected right-sided hypermetabolic lesions not well
delineated on present plain film exam.

Left upper lobe 6.2 x 2.2 cm lesion unchanged.

Asymmetric pulmonary vascular congestion greater on the right.

Mild cardiomegaly.

Calcified slightly tortuous aorta.
IMPRESSION: Small right apical pneumothorax (approximately 5-10%).

Posteriorly layering right-sided pleural effusion.

PET CT detected right-sided hypermetabolic lesions not well
delineated on present plain film exam.

Left upper lobe 6.2 x 2.2 cm lesion unchanged.

Asymmetric pulmonary vascular congestion greater on the right.

Aortic Atherosclerosis ([id number]-3K7.7).

These results will be called to the ordering clinician or
representative by the Radiologist Assistant, and communication
documented in the PACS or zVision Dashboard.
